# Patient Record
Sex: Female | Born: 1960 | Race: White | Hispanic: No | Smoking: Current every day smoker
Health system: Southern US, Community
[De-identification: ages and names within clinical notes are randomized; demographics above are authoritative.]

## PROBLEM LIST (undated history)

## (undated) DIAGNOSIS — E559 Vitamin D deficiency, unspecified: Secondary | ICD-10-CM

## (undated) DIAGNOSIS — E1140 Type 2 diabetes mellitus with diabetic neuropathy, unspecified: Secondary | ICD-10-CM

## (undated) DIAGNOSIS — IMO0002 Reserved for concepts with insufficient information to code with codable children: Secondary | ICD-10-CM

## (undated) DIAGNOSIS — M5432 Sciatica, left side: Secondary | ICD-10-CM

## (undated) DIAGNOSIS — F319 Bipolar disorder, unspecified: Secondary | ICD-10-CM

## (undated) DIAGNOSIS — E039 Hypothyroidism, unspecified: Secondary | ICD-10-CM

## (undated) DIAGNOSIS — M810 Age-related osteoporosis without current pathological fracture: Secondary | ICD-10-CM

## (undated) DIAGNOSIS — F410 Panic disorder [episodic paroxysmal anxiety] without agoraphobia: Secondary | ICD-10-CM

## (undated) DIAGNOSIS — M5126 Other intervertebral disc displacement, lumbar region: Secondary | ICD-10-CM

## (undated) DIAGNOSIS — S322XXA Fracture of coccyx, initial encounter for closed fracture: Secondary | ICD-10-CM

## (undated) DIAGNOSIS — M549 Dorsalgia, unspecified: Secondary | ICD-10-CM

## (undated) DIAGNOSIS — M5430 Sciatica, unspecified side: Secondary | ICD-10-CM

## (undated) DIAGNOSIS — G8929 Other chronic pain: Secondary | ICD-10-CM

## (undated) DIAGNOSIS — F32A Depression, unspecified: Secondary | ICD-10-CM

## (undated) DIAGNOSIS — F329 Major depressive disorder, single episode, unspecified: Secondary | ICD-10-CM

## (undated) HISTORY — PX: CLOSED REDUCTION PROXIMAL FIBULAR FRACTURE: SUR232

## (undated) HISTORY — DX: Sciatica, left side: M54.32

## (undated) HISTORY — PX: FOOT SURGERY: SHX648

## (undated) HISTORY — DX: Vitamin D deficiency, unspecified: E55.9

## (undated) HISTORY — PX: TONSILLECTOMY: SUR1361

## (undated) HISTORY — PX: ABDOMINAL HYSTERECTOMY: SHX81

---

## 1997-04-03 ENCOUNTER — Ambulatory Visit (HOSPITAL_COMMUNITY): Admission: RE | Admit: 1997-04-03 | Discharge: 1997-04-03 | Payer: Self-pay | Admitting: Obstetrics and Gynecology

## 1997-08-30 ENCOUNTER — Emergency Department (HOSPITAL_COMMUNITY): Admission: EM | Admit: 1997-08-30 | Discharge: 1997-08-30 | Payer: Self-pay | Admitting: Emergency Medicine

## 1997-09-09 ENCOUNTER — Encounter: Admission: RE | Admit: 1997-09-09 | Discharge: 1997-12-08 | Payer: Self-pay | Admitting: Anesthesiology

## 1998-05-13 ENCOUNTER — Ambulatory Visit (HOSPITAL_COMMUNITY): Admission: RE | Admit: 1998-05-13 | Discharge: 1998-05-13 | Payer: Self-pay | Admitting: Orthopedic Surgery

## 1998-05-13 ENCOUNTER — Encounter: Payer: Self-pay | Admitting: Orthopedic Surgery

## 1998-06-10 ENCOUNTER — Other Ambulatory Visit: Admission: RE | Admit: 1998-06-10 | Discharge: 1998-06-10 | Payer: Self-pay | Admitting: Obstetrics and Gynecology

## 1998-07-08 ENCOUNTER — Other Ambulatory Visit: Admission: RE | Admit: 1998-07-08 | Discharge: 1998-07-08 | Payer: Self-pay | Admitting: Obstetrics and Gynecology

## 1998-08-28 ENCOUNTER — Encounter: Payer: Self-pay | Admitting: Obstetrics and Gynecology

## 1998-08-28 ENCOUNTER — Ambulatory Visit (HOSPITAL_COMMUNITY): Admission: RE | Admit: 1998-08-28 | Discharge: 1998-08-28 | Payer: Self-pay | Admitting: Obstetrics and Gynecology

## 1998-09-24 ENCOUNTER — Encounter: Payer: Self-pay | Admitting: Gastroenterology

## 1998-09-24 ENCOUNTER — Ambulatory Visit (HOSPITAL_COMMUNITY): Admission: RE | Admit: 1998-09-24 | Discharge: 1998-09-24 | Payer: Self-pay | Admitting: Gastroenterology

## 1998-10-15 ENCOUNTER — Other Ambulatory Visit: Admission: RE | Admit: 1998-10-15 | Discharge: 1998-10-15 | Payer: Self-pay | Admitting: Obstetrics and Gynecology

## 1999-02-17 ENCOUNTER — Encounter: Payer: Self-pay | Admitting: Obstetrics and Gynecology

## 1999-02-17 ENCOUNTER — Ambulatory Visit (HOSPITAL_COMMUNITY): Admission: RE | Admit: 1999-02-17 | Discharge: 1999-02-17 | Payer: Self-pay | Admitting: Obstetrics and Gynecology

## 1999-02-24 ENCOUNTER — Encounter (INDEPENDENT_AMBULATORY_CARE_PROVIDER_SITE_OTHER): Payer: Self-pay

## 1999-02-24 ENCOUNTER — Encounter: Payer: Self-pay | Admitting: Obstetrics and Gynecology

## 1999-02-24 ENCOUNTER — Ambulatory Visit (HOSPITAL_COMMUNITY): Admission: RE | Admit: 1999-02-24 | Discharge: 1999-02-24 | Payer: Self-pay | Admitting: Obstetrics and Gynecology

## 2001-03-06 ENCOUNTER — Encounter: Payer: Self-pay | Admitting: Emergency Medicine

## 2001-03-06 ENCOUNTER — Emergency Department (HOSPITAL_COMMUNITY): Admission: EM | Admit: 2001-03-06 | Discharge: 2001-03-06 | Payer: Self-pay | Admitting: Emergency Medicine

## 2001-05-05 ENCOUNTER — Other Ambulatory Visit: Admission: RE | Admit: 2001-05-05 | Discharge: 2001-05-05 | Payer: Self-pay | Admitting: *Deleted

## 2001-05-10 ENCOUNTER — Encounter: Payer: Self-pay | Admitting: *Deleted

## 2001-05-10 ENCOUNTER — Encounter: Admission: RE | Admit: 2001-05-10 | Discharge: 2001-05-10 | Payer: Self-pay | Admitting: *Deleted

## 2002-03-01 ENCOUNTER — Ambulatory Visit (HOSPITAL_COMMUNITY): Admission: RE | Admit: 2002-03-01 | Discharge: 2002-03-01 | Payer: Self-pay | Admitting: Family Medicine

## 2002-03-02 ENCOUNTER — Ambulatory Visit (HOSPITAL_COMMUNITY): Admission: RE | Admit: 2002-03-02 | Discharge: 2002-03-02 | Payer: Self-pay | Admitting: Family Medicine

## 2002-03-02 ENCOUNTER — Encounter: Payer: Self-pay | Admitting: Family Medicine

## 2002-03-16 ENCOUNTER — Ambulatory Visit (HOSPITAL_COMMUNITY): Admission: RE | Admit: 2002-03-16 | Discharge: 2002-03-16 | Payer: Self-pay | Admitting: Obstetrics and Gynecology

## 2002-03-16 ENCOUNTER — Encounter (INDEPENDENT_AMBULATORY_CARE_PROVIDER_SITE_OTHER): Payer: Self-pay

## 2002-06-25 ENCOUNTER — Encounter (INDEPENDENT_AMBULATORY_CARE_PROVIDER_SITE_OTHER): Payer: Self-pay

## 2002-06-25 ENCOUNTER — Observation Stay (HOSPITAL_COMMUNITY): Admission: RE | Admit: 2002-06-25 | Discharge: 2002-06-26 | Payer: Self-pay

## 2002-08-30 ENCOUNTER — Ambulatory Visit (HOSPITAL_BASED_OUTPATIENT_CLINIC_OR_DEPARTMENT_OTHER): Admission: RE | Admit: 2002-08-30 | Discharge: 2002-08-30 | Payer: Self-pay | Admitting: Orthopedic Surgery

## 2002-12-25 ENCOUNTER — Encounter: Admission: RE | Admit: 2002-12-25 | Discharge: 2002-12-25 | Payer: Self-pay | Admitting: Gastroenterology

## 2003-05-07 ENCOUNTER — Encounter: Admission: RE | Admit: 2003-05-07 | Discharge: 2003-05-07 | Payer: Self-pay | Admitting: Family Medicine

## 2003-05-16 ENCOUNTER — Encounter: Admission: RE | Admit: 2003-05-16 | Discharge: 2003-05-16 | Payer: Self-pay | Admitting: Family Medicine

## 2003-06-04 ENCOUNTER — Encounter: Admission: RE | Admit: 2003-06-04 | Discharge: 2003-06-04 | Payer: Self-pay | Admitting: Family Medicine

## 2003-07-26 ENCOUNTER — Inpatient Hospital Stay (HOSPITAL_COMMUNITY): Admission: EM | Admit: 2003-07-26 | Discharge: 2003-07-28 | Payer: Self-pay | Admitting: Emergency Medicine

## 2003-08-27 ENCOUNTER — Encounter: Admission: RE | Admit: 2003-08-27 | Discharge: 2003-08-27 | Payer: Self-pay | Admitting: Family Medicine

## 2003-09-10 ENCOUNTER — Encounter: Admission: RE | Admit: 2003-09-10 | Discharge: 2003-09-10 | Payer: Self-pay | Admitting: Family Medicine

## 2004-01-09 ENCOUNTER — Ambulatory Visit (HOSPITAL_COMMUNITY): Admission: RE | Admit: 2004-01-09 | Discharge: 2004-01-09 | Payer: Self-pay | Admitting: Family Medicine

## 2004-03-09 ENCOUNTER — Encounter: Admission: RE | Admit: 2004-03-09 | Discharge: 2004-03-09 | Payer: Self-pay | Admitting: Family Medicine

## 2004-03-12 ENCOUNTER — Emergency Department (HOSPITAL_COMMUNITY): Admission: EM | Admit: 2004-03-12 | Discharge: 2004-03-13 | Payer: Self-pay | Admitting: Emergency Medicine

## 2004-03-20 ENCOUNTER — Encounter: Admission: RE | Admit: 2004-03-20 | Discharge: 2004-03-20 | Payer: Self-pay | Admitting: Family Medicine

## 2004-07-28 ENCOUNTER — Inpatient Hospital Stay (HOSPITAL_COMMUNITY): Admission: EM | Admit: 2004-07-28 | Discharge: 2004-08-01 | Payer: Self-pay | Admitting: Emergency Medicine

## 2004-08-01 ENCOUNTER — Inpatient Hospital Stay (HOSPITAL_COMMUNITY): Admission: AD | Admit: 2004-08-01 | Discharge: 2004-08-05 | Payer: Self-pay | Admitting: Psychiatry

## 2004-08-01 ENCOUNTER — Ambulatory Visit: Payer: Self-pay | Admitting: Psychiatry

## 2004-11-09 ENCOUNTER — Ambulatory Visit (HOSPITAL_COMMUNITY): Admission: RE | Admit: 2004-11-09 | Discharge: 2004-11-09 | Payer: Self-pay | Admitting: Family Medicine

## 2005-07-09 ENCOUNTER — Ambulatory Visit: Payer: Self-pay | Admitting: Internal Medicine

## 2005-08-24 ENCOUNTER — Ambulatory Visit: Payer: Self-pay | Admitting: Family Medicine

## 2005-08-25 ENCOUNTER — Ambulatory Visit: Payer: Self-pay | Admitting: *Deleted

## 2005-09-10 ENCOUNTER — Ambulatory Visit: Payer: Self-pay | Admitting: Internal Medicine

## 2005-09-17 ENCOUNTER — Ambulatory Visit (HOSPITAL_COMMUNITY): Admission: RE | Admit: 2005-09-17 | Discharge: 2005-09-17 | Payer: Self-pay | Admitting: Internal Medicine

## 2005-09-24 ENCOUNTER — Ambulatory Visit: Payer: Self-pay | Admitting: Internal Medicine

## 2005-10-01 ENCOUNTER — Ambulatory Visit (HOSPITAL_COMMUNITY): Admission: RE | Admit: 2005-10-01 | Discharge: 2005-10-01 | Payer: Self-pay | Admitting: Internal Medicine

## 2005-10-06 ENCOUNTER — Ambulatory Visit: Payer: Self-pay | Admitting: Family Medicine

## 2005-10-09 ENCOUNTER — Emergency Department (HOSPITAL_COMMUNITY): Admission: EM | Admit: 2005-10-09 | Discharge: 2005-10-09 | Payer: Self-pay | Admitting: Family Medicine

## 2005-11-16 ENCOUNTER — Emergency Department (HOSPITAL_COMMUNITY): Admission: EM | Admit: 2005-11-16 | Discharge: 2005-11-16 | Payer: Self-pay | Admitting: Family Medicine

## 2006-06-23 IMAGING — NM NM BONE MULTIPLE AREAS
8 series · 8 of 8 positions shown · non-contrast
Comparison: none

CLINICAL DATA: SI joint and coccygeal pain. 
 NUCLEAR MEDICINE BONE SCAN ? MULTIPLE:
TECHNIQUE: Early and delayed static images were obtained in the pelvis in multiple planes and projections.  
 25.3 mCi VcDDm MDP utilized. 
 On both early and delayed static images, no abnormal uptake is noted in the SI joints, sacrococcygeal region, or elsewhere in the pelvis.  The kidneys function without obstruction.

[st statics, dual detec · 2.36mm/px · 1 of 1 slices shown (1 of 8)]
[im 1/1]
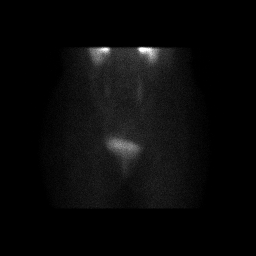

[st statics, dual detec · 2.36mm/px · 1 of 1 slices shown (2 of 8)]
[im 1/1]
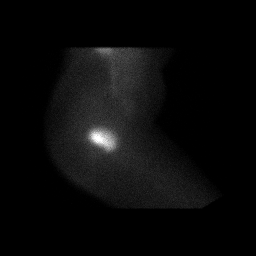

[st statics, dual detec · 2.33mm/px · 1 of 1 slices shown (3 of 8)]
[im 1/1]
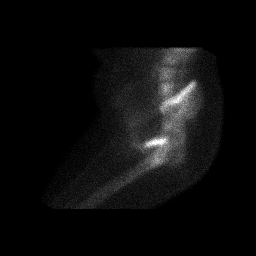

[st statics, dual detec · 2.33mm/px · 1 of 1 slices shown (4 of 8)]
[im 1/1]
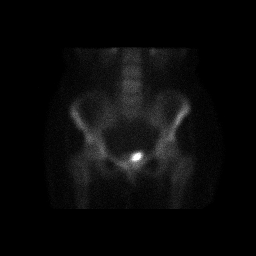

[st statics, dual detec · 2.36mm/px · 1 of 1 slices shown (5 of 8)]
[im 1/1]
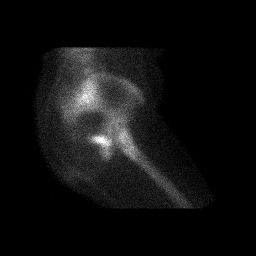

[st statics, dual detec · 2.33mm/px · 1 of 1 slices shown (6 of 8)]
[im 1/1]
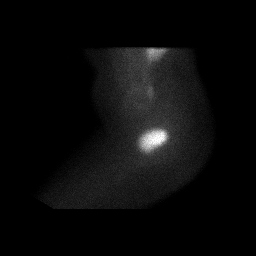

[st statics, dual detec · 2.36mm/px · 1 of 1 slices shown (7 of 8)]
[im 1/1]
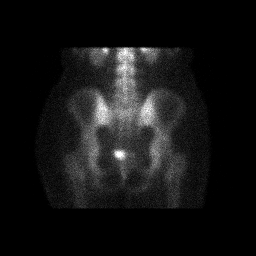

[st statics, dual detec · 2.33mm/px · 1 of 1 slices shown (8 of 8)]
[im 1/1]
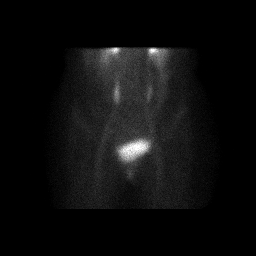

[8 of 8 positions shown; findings below may reference images not displayed]

IMPRESSION: Normal exam.

## 2007-03-28 ENCOUNTER — Ambulatory Visit (HOSPITAL_COMMUNITY): Admission: RE | Admit: 2007-03-28 | Discharge: 2007-03-28 | Payer: Self-pay | Admitting: Internal Medicine

## 2007-08-12 ENCOUNTER — Emergency Department (HOSPITAL_COMMUNITY): Admission: EM | Admit: 2007-08-12 | Discharge: 2007-08-13 | Payer: Self-pay | Admitting: Family Medicine

## 2008-03-20 ENCOUNTER — Ambulatory Visit: Admission: RE | Admit: 2008-03-20 | Discharge: 2008-03-20 | Payer: Self-pay | Admitting: Anesthesiology

## 2008-05-10 ENCOUNTER — Ambulatory Visit (HOSPITAL_COMMUNITY): Admission: RE | Admit: 2008-05-10 | Discharge: 2008-05-10 | Payer: Self-pay | Admitting: Internal Medicine

## 2008-06-10 ENCOUNTER — Ambulatory Visit (HOSPITAL_COMMUNITY): Admission: RE | Admit: 2008-06-10 | Discharge: 2008-06-10 | Payer: Self-pay | Admitting: Internal Medicine

## 2009-05-19 ENCOUNTER — Encounter: Payer: Self-pay | Admitting: Cardiology

## 2009-07-01 ENCOUNTER — Encounter: Payer: Self-pay | Admitting: Cardiology

## 2009-07-01 DIAGNOSIS — R5381 Other malaise: Secondary | ICD-10-CM

## 2009-07-01 DIAGNOSIS — E039 Hypothyroidism, unspecified: Secondary | ICD-10-CM | POA: Insufficient documentation

## 2009-07-01 DIAGNOSIS — R002 Palpitations: Secondary | ICD-10-CM | POA: Insufficient documentation

## 2009-07-01 DIAGNOSIS — R5383 Other fatigue: Secondary | ICD-10-CM

## 2009-12-24 ENCOUNTER — Emergency Department (HOSPITAL_COMMUNITY): Admission: EM | Admit: 2009-12-24 | Discharge: 2009-12-24 | Payer: Self-pay | Admitting: Emergency Medicine

## 2010-01-12 ENCOUNTER — Emergency Department (HOSPITAL_COMMUNITY)
Admission: EM | Admit: 2010-01-12 | Discharge: 2010-01-12 | Payer: Self-pay | Source: Home / Self Care | Admitting: Emergency Medicine

## 2010-01-27 ENCOUNTER — Encounter
Admission: RE | Admit: 2010-01-27 | Discharge: 2010-01-27 | Payer: Self-pay | Source: Home / Self Care | Attending: Neurosurgery | Admitting: Neurosurgery

## 2010-01-28 ENCOUNTER — Encounter
Admission: RE | Admit: 2010-01-28 | Discharge: 2010-01-28 | Payer: Self-pay | Source: Home / Self Care | Attending: Neurosurgery | Admitting: Neurosurgery

## 2010-02-21 ENCOUNTER — Encounter: Payer: Self-pay | Admitting: Gastroenterology

## 2010-02-21 ENCOUNTER — Encounter: Payer: Self-pay | Admitting: Family Medicine

## 2010-02-22 ENCOUNTER — Encounter: Payer: Self-pay | Admitting: Internal Medicine

## 2010-02-22 ENCOUNTER — Encounter: Payer: Self-pay | Admitting: Family Medicine

## 2010-03-03 NOTE — Letter (Signed)
Summary: Appointment -missed  [facility] at [facility] [address]   [place], [place] [address]   Phone: [phone]  Fax: [phone]     Jul 01, 2009 MRN: [id number]     Kari Gonzales [address] [place], [place]  [address]     Dear Ms. Kari Gonzales,  Our records indicate you missed your appointment on Jul 01, 2009                        with Dr.   Antoine Poche.   It is very important that we reach you to reschedule this appointment. We look forward to participating in your health care needs.   Please contact us at the number listed above at your earliest convenience to reschedule this appointment.   Sincerely,    Glass blower/designer

## 2010-03-03 NOTE — Letter (Signed)
Summary: External Correspondence/ OFFICE VISIT DR. ZACK HALL  External Correspondence/ OFFICE VISIT DR. ZACK HALL   Imported By: Dorise Hiss 07/01/2009 09:26:31  _____________________________________________________________________  External Attachment:    Type:   Image     Comment:   External Document

## 2010-03-03 NOTE — Letter (Signed)
Summary: External Correspondence/ OFFICE VISIT DR. MCINNIS  External Correspondence/ OFFICE VISIT DR. MCINNIS   Imported By: Dorise Hiss 06/05/2009 14:26:59  _____________________________________________________________________  External Attachment:    Type:   Image     Comment:   External Document

## 2010-03-12 ENCOUNTER — Emergency Department (HOSPITAL_COMMUNITY)
Admission: EM | Admit: 2010-03-12 | Discharge: 2010-03-12 | Disposition: A | Discharge status: Home / Self Care | Payer: Medicare Other | Attending: Emergency Medicine | Admitting: Emergency Medicine

## 2010-03-12 DIAGNOSIS — E119 Type 2 diabetes mellitus without complications: Secondary | ICD-10-CM | POA: Insufficient documentation

## 2010-03-12 DIAGNOSIS — IMO0002 Reserved for concepts with insufficient information to code with codable children: Secondary | ICD-10-CM | POA: Insufficient documentation

## 2010-04-08 ENCOUNTER — Encounter: Payer: Self-pay | Admitting: Family Medicine

## 2010-04-08 ENCOUNTER — Ambulatory Visit (INDEPENDENT_AMBULATORY_CARE_PROVIDER_SITE_OTHER): Payer: Medicare Other | Admitting: Family Medicine

## 2010-04-08 DIAGNOSIS — M810 Age-related osteoporosis without current pathological fracture: Secondary | ICD-10-CM | POA: Insufficient documentation

## 2010-04-08 DIAGNOSIS — M2170 Unequal limb length (acquired), unspecified site: Secondary | ICD-10-CM | POA: Insufficient documentation

## 2010-04-08 DIAGNOSIS — M76899 Other specified enthesopathies of unspecified lower limb, excluding foot: Secondary | ICD-10-CM

## 2010-04-08 DIAGNOSIS — M545 Low back pain: Secondary | ICD-10-CM

## 2010-04-08 DIAGNOSIS — M533 Sacrococcygeal disorders, not elsewhere classified: Secondary | ICD-10-CM

## 2010-04-08 DIAGNOSIS — E109 Type 1 diabetes mellitus without complications: Secondary | ICD-10-CM | POA: Insufficient documentation

## 2010-04-14 NOTE — Assessment & Plan Note (Signed)
Summary: BACK PAIN Room 5   Vital Signs:  Patient Profile:   50 Years Old Female CC:      [name] radiates into left leg, diabetic neuropathy in right calf Height:     65 inches Weight:      163 pounds O2 Sat:      98 % O2 treatment:    Room Air Temp:     98.7 degrees F oral Pulse rate:   102 / minute Pulse rhythm:   regular Resp:     18 per minute BP sitting:   126 / 86  (left arm) Cuff size:   regular  Vitals Entered By: Emilio Math (April 08, 2010 2:34 PM)                  Current Allergies: ! SULFA ! MORPHINEHistory of Present Illness Chief Complaint: [name] radiates into left leg, diabetic neuropathy in right calf History of Present Illness:  Subjective:  Patient states that she has a long history of recurring lower back pain and left sciatica.  She notes that her left leg is 3/4inch longer than right, and she has an orthotic insert for her right shoe but often does not use it because she must wear high top shoes.  She developed flareup of pain in her left hip area yesterday.  She has pain with ambulation, sitting, and climbing stairs.  No bowel or bladder dysfunction.  No saddle numbness.  No fevers, chills, and sweats.  No weight loss.  No recent trauma or change in activities. She states that she has an up-coming appt with a new PCP for management of chronic problems.  REVIEW OF SYSTEMS Constitutional Symptoms      Denies fever, chills, night sweats, weight loss, weight gain, and fatigue.  Eyes       Denies change in vision, eye pain, eye discharge, glasses, contact lenses, and eye surgery. Ear/Nose/Throat/Mouth       Denies hearing loss/aids, change in hearing, ear pain, ear discharge, dizziness, frequent runny nose, frequent nose bleeds, sinus problems, sore throat, hoarseness, and tooth pain or bleeding.  Respiratory       Denies dry cough, productive cough, wheezing, shortness of breath, asthma, bronchitis, and emphysema/COPD.  Cardiovascular       Denies  murmurs, chest pain, and tires easily with exhertion.    Gastrointestinal       Denies stomach pain, nausea/vomiting, diarrhea, constipation, blood in bowel movements, and indigestion. Genitourniary       Denies painful urination, kidney stones, and loss of urinary control. Neurological       Denies paralysis, seizures, and fainting/blackouts. Musculoskeletal       Complains of muscle pain, joint pain, joint stiffness, and decreased range of motion.      Denies redness, swelling, muscle weakness, and gout.  Skin       Denies bruising, unusual mles/lumps or sores, and hair/skin or nail changes.  Psych       Denies mood changes, temper/anger issues, anxiety/stress, speech problems, depression, and sleep problems.  Past History:  Past Medical History: Weakness Paliptations Hypothyroidism Bulging discs Diabetes mellitus, type I Osteoporosis  Past Surgical History: Hysterectomy Tonsillectomy  Family History: Mother, Raynaud Disease, RA Father, Cardiomegly, HTN  Social History: 1/2 ppd, 30 yrs ETOH-yes No Drugs Disability   Objective:  Patient in no acute distress, but appears uncomfortable when standing/walking Eyes:  Pupils are equal, round, and reactive to light and accomdation.  Extraocular movement is intact.  Conjunctivae  are not inflamed.  Pharynx:  Normal  Neck:  Supple.  No adenopathy is present.  No thyromegaly is present  Lungs:  Clear to auscultation.  Breath sounds are equal.  Heart:  Regular rate and rhythm without murmurs, rubs, or gallops.  Abdomen:  Nontender without masses or hepatosplenomegaly.  Bowel sounds are present.  No CVA or flank tenderness.   Back:   Decreased range of motion.   Tenderness in the midline and left  paraspinous muscles from L3 to Sacral area.  Distinct tenderness over the left SI joint.  Straight leg raising test is negative.  Sitting knee extension test is negative.  Strength and sensation in the lower extremities is normal.   Patellar and achilles reflexes are normal.  Extremities:  No edema.  There is distinct tenderness over the left greater trochanter.  Palpation there recreates her pain, especially with resisted lateral abduction.  Left leg is approximately 2cm longer than the right by inspection. 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) TROCHANTERIC BURSITIS, LEFT (ICD-726.5) BACK PAIN, LUMBAR, CHRONIC (ICD-724.2) OSTEOPOROSIS (ICD-733.00) DIABETES MELLITUS, TYPE I (ICD-250.01)  SUSPECT LEFT SACROILIAC JOINT INFLAMMATION  Plan New Medications/Changes: LORTAB 5 5-500 MG TABS (HYDROCODONE-ACETAMINOPHEN) One or two tabs by mouth hs as needed pain  #12 (twelve) x 0, 04/08/2010, Donna Christen MD VOLTAREN 1 % GEL (DICLOFENAC SODIUM) Apply 3gms  to affected area two times a day to three times a day  #100gm x 2, 04/08/2010, Donna Christen MD  New Orders: Admin of Therapeutic Inj  intramuscular or subcutaneous [40981] New Patient Level IV [99204] Injection, Tendon / Ligament [20550] Ketorolac-Toradol 15mg  [J1885] Planning Comments:   Toradol 60mg  IM Trigger point injection left trochanteric bursa. Begin ice packs several times daily.  Analgesic for bedtime.  Rx for Voltaren gel to apply to lower back.  Begin back exercises ([facility] information and instruction patient handout given).  Recommend that she resume her orthotic device in right shoe.  Recommend follow-up with PCP for management of chronic problems.   The patient and/or caregiver has been counseled thoroughly with regard to medications prescribed including dosage, schedule, interactions, rationale for use, and possible side effects and they verbalize understanding.  Diagnoses and expected course of recovery discussed and will return if not improved as expected or if the condition worsens. Patient and/or caregiver verbalized understanding.   PROCEDURE:   Joint Injection Site:  Left hip greater trochanter Anesthesia: Local 1% plain Lidocaine Procedure:  With sterile technique and local anesthesia with 1% plain lidocaine, injected 20mg  Aristocort with 1.5cc of 1% plain lidocaine over the greater trochanter in a fan pattern.  The risks and benefits were explained to the patient prior to performing the procedure and consent was obtained.  Disposition: Home Prescriptions: LORTAB 5 5-500 MG TABS (HYDROCODONE-ACETAMINOPHEN) One or two tabs by mouth hs as needed pain  #12 (twelve) x 0   Entered and Authorized by:   Donna Christen MD   Signed by:   Donna Christen MD on 04/08/2010   Method used:   Print then Give to Patient   RxID:   [id number] VOLTAREN 1 % GEL (DICLOFENAC SODIUM) Apply 3gms  to affected area two times a day to three times a day  #100gm x 2   Entered and Authorized by:   Donna Christen MD   Signed by:   Donna Christen MD on 04/08/2010   Method used:   Print then Give to Patient   RxID:  [id number]   Medication Administration  Injection # 1:    Medication: Ketorolac-Toradol 15mg     Diagnosis: OSTEOPOROSIS (ICD-733.00)    Route: IM    Site: RUOQ gluteus    Exp Date: 12/03/2010    Lot #: [id number]    Mfr: hospira    Comments: 60mg  given    Patient tolerated injection without complications    Given by: Lajean Saver RN (April 08, 2010 3:18 PM)  Orders Added: 1)  Admin of Therapeutic Inj  intramuscular or subcutaneous [96372] 2)  New Patient Level IV [99204] 3)  Injection, Tendon / Ligament [20550] 4)  Ketorolac-Toradol 15mg  [W4132]

## 2010-04-22 ENCOUNTER — Telehealth: Payer: Self-pay | Admitting: Emergency Medicine

## 2010-04-30 NOTE — Progress Notes (Signed)
     Follow-up for Phone Call       Follow-up by: Hoyt Koch MD,  April 22, 2010 6:38 PM    Additional Follow-up for Phone Call Additional follow up Details #2::    Patient came to clinic complaining of same symptoms as last week (sciatica symptoms,  hip pain).  Looking on the [place] registry, she was just prescribed 1 week ago Soma #63 and Vicoprofen #45.  She has received 7 narcotic Rx in 2012 so far.  We referred her directly to ortho since we do not manage chronic pain.  I followed the patient outside and watched as she changed from a limping gait and holding her back to a completely normal gait in our parking lot and was able to get into her car with visibly no difficulty.  From her online records, I believe she might be a narcotic abuser and needs to be followed by her PCP and perhaps pain management for her problems (she already sees Dr. Yetta Barre for psych).  She needs to choose 1 physician and stick with them rather than going to multiple doctors for her pain symptoms.  We will not be refilling any medications here at this clinic for any pain symptoms.  If she has worsening pain, she was advised to return to the ER tonight if necessary. Follow-up by: Hoyt Koch MD,  April 22, 2010 6:43 PM

## 2010-06-16 NOTE — Procedures (Signed)
Kari Gonzales, Kari Gonzales               ACCOUNT NO.:  [id number]   MEDICAL RECORD NO.:  [id number]          PATIENT TYPE:  OUT   LOCATION:  SLEE                          FACILITY:  [facility]   PHYSICIAN:  Kofi A. Gerilyn Pilgrim, M.D. DATE OF BIRTH:  [birth year]   DATE OF PROCEDURE:  03/20/2008  DATE OF DISCHARGE:  03/20/2008                             SLEEP DISORDER REPORT   NOCTURNAL POLYSOMNOGRAPHY REPORT   REFERRING PHYSICIAN:  Catalina Pizza, MD   INDICATION:  This is a 50 year old lady who presents with snoring,  fatigue, insomnia, and is being evaluated for obstructive sleep apnea  syndrome.   MEDICATIONS:  Vicodin, levothyroxine, Benadryl, multivitamin, and Gas-X.   Epworth sleepiness scale 8.  BMI 30.   ARCHITECTURAL SUMMARY:  The total recording time is 383 minutes.  The  sleep efficiency is 77%, sleep latency is 67 minutes, and REM latency  100 minutes.  Stage N1 is 3%, N2 40%, N3 41%, and REM sleep 16%.   RESPIRATORY SUMMARY:  Oxygen saturation at baseline is 98%, the lowest  is 86%.  AHI is 7.   LIMB MOVEMENT INDEX:  PLM index is 0.   ELECTROCARDIOGRAM SUMMARY:  The average heart rate is 70 with no  significant dysrhythmias observed.   IMPRESSION:  Mild obstructive sleep apnea syndrome that does not require  positive pressure treatments.  Thanks for this referral.      Kofi A. Gerilyn Pilgrim, M.D.  Electronically Signed     KAD/MEDQ  D:  03/27/2008  T:  03/27/2008  Job:  [id number]
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
## 2010-06-19 NOTE — Op Note (Signed)
NAME:  Kari Gonzales                           ACCOUNT NO.:  [id number]   MEDICAL RECORD NO.:  [id number]                   PATIENT TYPE:  OBV   LOCATION:  9124                                 FACILITY:  [facility]   PHYSICIAN:  Lenoard Aden, M.D.             DATE OF BIRTH:  [birth year]   DATE OF PROCEDURE:  06/25/2002  DATE OF DISCHARGE:                                 OPERATIVE REPORT   PREOPERATIVE DIAGNOSES:  1. Menomenorrhagia.  2. Pelvic pain.  3. Dysmenorrhea.   POSTOPERATIVE DIAGNOSES:  1. Menomenorrhagia.  2. Pelvic pain.  3. Dysmenorrhea.  4. Enterocele.   PROCEDURE:  Laparoscopically assisted vaginal hysterectomy and McCall  culdoplasty.   SURGEON:  Lenoard Aden, M.D.   ASSISTANT:  Pershing Cox, M.D.   ANESTHESIA:  General anesthesia.   ESTIMATED BLOOD LOSS:  100 mL.   COMPLICATIONS:  None.   DRAINS:  Foley catheter and vaginal packing.   DISPOSITION:  The patient taken to recovery room in good condition.   FINDINGS:  A 110 g uterus, normal tubes and normal ovaries.   DESCRIPTION OF PROCEDURE:  After being apprised of the risks of anesthesia,  infection, bleeding, inability to cure pelvic pain, the patient was brought  to the operating room and administered general anesthetic without  complications, prepped and draped in the usual sterile fashion.  Foley  catheter placed.  After achieving adequate anesthesia, Hulka tenaculum was  placed per vagina.  Exam under anesthesia reveals a retroflexed uterus and  no adnexal masses.  Dilute Marcaine solution placed in the umbilicus.  Feet  previously placed in Allen stirrups.  At this time, infraumbilical incision  made with a scalpel.  Veress needle placed.  Admitting pressure -2 noted.  Four liters CO2 insufflated without difficulty.  The 5 mm trocar sites were  placed in the bilateral right and left lower quadrant ports after  transillumination.  Previous visualization done prior to the placement of  the 5 mm trocars reveals atraumatic trocars reveals atraumatic trocar entry.  Normal bowel and omentum.  Normal liver, gallbladder bed.  Appendix not  visualized.  Bulky retroflexed uterus.  No evidence of endometriosis.  Bilateral normal tubes and ovaries.   At this time, the left round ligament was grasped and ligated using tripolar  tubo-ovarian ligament was grasped and ligated using the tripolar  skeletonized down to the level of the uterine vessels by dividing the broad  ligament using the tripolar ureter has been identified on the left side.  This procedure was taken down to the level of the uterine vessels with  identification of the ureters during the entire process.  The same procedure  was done on the right side without complication.  Bladder flap was developed  sharply using fine scissors in atraumatic fashion of the lower uterine  segment.  Bladder flap was well developed.  Attention is then turned to the  vaginal portion of the procedure where a biweighted speculum was placed.  The cervix was grasped anteriorly and posteriorly using Jacob's tenaculum  and infiltrated using dilute Pitressin solution circumferentially at the  cervicovaginal junction.  This area was then undermined and scored using  electrocautery and undermined using Metzenbaum scissors.   Posterior cul-de-sac entry was made atraumatically.  Long weighted speculum  was placed.  Bilateral uterosacral ligaments were grasped with the Heaney  clamp, suture ligated and transfixed to the vaginal cuff.  Second bite along  the cardinal ligament was taken and transected via the suture of 0 Vicryl.  LigaSure was then used to ligate and cut progressively the cardinal and  broad ligament pedicles before which anterior cul-de-sac entry is made  atraumatically and sharply. Deaver is placed. Then these pedicles of the  uterine vesicles, broad and cardinal ligament complexes were cauterized,  desiccated and divided using the  LigaSure.  Specimen is removed.  Good  hemostasis was noted.  Enterocele was identified and closed using an  internal and external McCall culdoplasty suture of 0 Vicryl suture.  Good  support of the posterior cul-de-sac was then obtained and reduction of the  enterocele was noted.  Vagina was then closed using 0 Vicryl pop offs in a  figure-of-eight fashion, interrupted sutures.  Uterosacrals were tied in the  midline.  Vaginal packings were placed.  Urine was clear.   Attention was then turned to the abdominal portion of the procedure where  normal tubes and ovaries were noted.  Ureters identified bilaterally.  Vaginal cuff appeared hemostatic.  There was some small bleeding along the  bladder flap which was cauterized using the tripolar.  Good hemostasis  achieved.  The CO2 was released.  Revisualization revealed no evidence of  active bleeding.  All instruments were removed under direct visualization.  CO2 was released.  Incisions were closed using 0 Vicryl and Dermabond.  The  patient tolerated the procedure well and was transferred to the recovery  room in good condition.                                               Lenoard Aden, M.D.    RJT/MEDQ  D:  06/25/2002  T:  06/25/2002  Job:  [id number]

## 2010-06-19 NOTE — Op Note (Signed)
   NAME:  Kari Gonzales                           ACCOUNT NO.:  [id number]   MEDICAL RECORD NO.:  [id number]                   PATIENT TYPE:  AMB   LOCATION:  SDC                                  FACILITY:  [facility]   PHYSICIAN:  Lenoard Aden, M.D.             DATE OF BIRTH:  October 21, 1960   DATE OF PROCEDURE:  03/16/2002  DATE OF DISCHARGE:                                 OPERATIVE REPORT   PREOPERATIVE DIAGNOSES:  Premature ovarian failure with postmenopausal  bleeding.   POSTOPERATIVE DIAGNOSES:  Premature ovarian failure with postmenopausal  bleeding.   PROCEDURE:  1. Diagnostic hysteroscopy.  2. Dilatation and curettage.   SURGEON:  Lenoard Aden, M.D.   ANESTHESIA:  General.   ESTIMATED BLOOD LOSS:  Less than 50 mL.   FLUID DEFICIT:  None.   COMPLICATIONS:  None.   DISPOSITION:  The patient to recovery in good condition.   BRIEF OPERATIVE NOTE:  After being apprised of the risks of anesthesia,  infection, bleeding, inability to cure pelvic pain with possible need for  further intervention, patient is brought to the operating room where she is  administered general anesthetic without complications, prepped and draped in  usual sterile fashion, catheterized until the bladder is empty.  Examination  under anesthesia reveals a retroflexed uterus and no adnexal masses are  appreciated on examination under anesthesia.  Dilute Pitressin solution  placed at 3 and 9 o'clock 10 mL in 100, total of 16 mL.  The cervix was  easily dilated up to a number 25 Pratt dilator.  Diagnostic hysteroscope  placed.  Visualization reveals an atrophic appearing endometrium, bilateral  normal tubal ostia.  At this time D&C is performed to obtain adequate  endometrial tissue.  Good hemostasis is achieved.  Fluid deficit of 0.  Repeat visualization reveals an otherwise normal endometrium.  No evidence  of polyps or fibroids are noted.  Instruments are removed from the vagina.  Minimal  bleeding is noted.  The patient tolerates procedure well and is  transferred to recovery room in good condition.   SPECIMENS:  Endometrial curettings to pathology.                                              Lenoard Aden, M.D.   RJT/MEDQ  D:  03/16/2002  T:  03/16/2002  Job:  [id number]   cc:   [facility]
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
## 2010-06-19 NOTE — H&P (Signed)
   NAME:  Kari Gonzales                           ACCOUNT NO.:  [id number]   MEDICAL RECORD NO.:  [id number]                   PATIENT TYPE:  AMB   LOCATION:  SDC                                  [facility]ILITY[facility]   PHYSICIAN:  Lenoard Aden, M.D.             DATE OF BIRTH:  [birth year]   DATE OF ADMISSION:  03/16/2002  DATE OF DISCHARGE:                                HISTORY & PHYSICAL   CHIEF COMPLAINT:  Lower abdominal pain and irregular bleeding.   HISTORY OF PRESENT ILLNESS:  The patient is a 50 year old white female G 4,  P 1-0-3-1 with a history of premature ovarian failure now with  postmenopausal bleeding.  The patient is unable to tolerate endometrial  biopsy and saline hysterography in the office, has had some decrease in  bleeding on Aygestin but has now had three weeks of continuous spotting for  a definitive evaluation.  She has also had lower abdominal cramps, bloating,  and questionable pain on the Aygestin.   MEDICATIONS:  Include Aygestin and Vicodin as needed.   PAST MEDICAL HISTORY:  Remarkable for osteoporosis by the patient report,  tonsillectomy, exploratory abdominal surgery with questionable LOA in 2001,  history of TAB x3, and a vaginal delivery x1.   ALLERGIES:  ASPIRIN and CODEINE.   FAMILY HISTORY:  Remarkable for hypertension, cervical cancer, and breast  cancer.   PHYSICAL EXAMINATION:  GENERAL:  The patient is an uncomfortable-appearing  white female in no apparent distress.  HEENT:  Normal.  LUNGS:  Clear.  HEART:  Regular rhythm.  ABDOMEN:  Soft, nontender.  PELVIC:  Reveals a retroflexed uterus and no adnexal masses noted.  Tenderness bilaterally on palpation to examination is noted.  EXTREMITIES:  Reveal no cords.  NEUROLOGICAL:  Nonfocal.   IMPRESSION:  Premature ovarian failure with postmenopausal bleeding,  intolerant; inability to perform endometrial biopsy or endometrial sampling.  Plan is to perform diagnostic hysteroscopy,  possible resectoscopic surgery,  will proceed with surgery.  Risks of anesthesia, infection, including injury  to abdominal organs, need for repair was discussed, the late versus  immediate complications to include bowel and bladder injury are noted.  The  patient acknowledges and wishes to proceed.                                               Lenoard Aden, M.D.    RJT/MEDQ  D:  03/16/2002  T:  03/16/2002  Job:  [id number]

## 2010-06-19 NOTE — H&P (Signed)
NAME:  Kari Gonzales                           ACCOUNT NO.:  [id number]   MEDICAL RECORD NO.:  [id number]                   PATIENT TYPE:  INP   LOCATION:  3313                                 FACILITY:  [facility]   PHYSICIAN:  Hettie Holstein, D.O.                 DATE OF BIRTH:  1960-02-18   DATE OF ADMISSION:  07/26/2003  DATE OF DISCHARGE:                                HISTORY & PHYSICAL   PRIMARY CARE PHYSICIAN:  Unassigned.   ADMITTING TEAM:  [facility].   CHIEF COMPLAINT:  Presented to the emergency department via EMS after being  found unresponsive and with medication overdoses.   HISTORY OF PRESENT ILLNESS:  This is a 50 year old female who was found  unresponsive by a friend after taking 2600 mg of Seroquel and 32 x 50 mg  tablets of over-the-counter sleeping aid.  Urine drug screen positive for  benzodiazepines, cocaine, in addition to positive tricyclic antidepressants.  EMS found patient unresponsive. She was tachycardic.  She was given Narcan  in the field, was assessed, and her oxygenation was intact.  In the  emergency department, she remained obtunded. She was given charcoal via NG  tube and had nasal trumpet placed until she awakened and exhibited airway  protection.  Poison control had been notified by the emergency department,  and protocols were followed.  EKG was reviewed and was without QT or QRS  prolongation.   PAST MEDICAL HISTORY:  Not obtainable from the patient .  Chart says status  post laparoscopic hysterectomy and culdoplasty.  She is G4, P1.  No other  further history can be obtained.   MEDICATIONS:  Home medications are unknown.   ALLERGIES:  Unknown; however, chart reveals ASPIRIN and CODEINE listed as  allergies.   SOCIAL HISTORY:  Unknown.   REVIEW OF SYSTEMS:  Not obtainable.   PHYSICAL EXAMINATION:  VITAL SIGNS:  Stable, though she is tachycardic with  a heart rate of 118, blood pressure 109/70, respirations 14, O2 saturation  on room air 100%.  GENERAL: She is stuporous.  She does follow verbal commands.  She is  incoherent and tearful.  She is restrained.  CARDIOVASCULAR:  Normal S1 and S2.  She is tachycardic.  LUNGS:  Clear bilaterally.  She is guarding her airway.  ABDOMEN:  Soft and nontender.  EXTREMITIES:  No edema.  Restraints properly applied.  NEUROLOGIC:  Her pupils are 4 mm bilaterally and reactive.  She moves all  four extremities spontaneously.  She opens eyes to commands, though she does  remain incoherent.   LABORATORY AND X-RAY DATA:  EKG revealed a QRS of 83 msec with sinus  tachycardia.  No prolonged QTC.   Urine drug screen revealed positive benzodiazepines, positive cocaine.  Hemoglobin 13.  Sodium 135, potassium 3.3, BUN 11, creatinine 0.7, chloride  103, CO2 21, glucose 201, AST 17, ALT 15, albumin 3.8.  Chest x-ray negative.   Tricyclics were positive.  Alcohol was negative.  Aspirin negative.  Tylenol  negative.   IMPRESSION:  1. Drug overdose of multiple substances, known Seroquel and over-the-counter     sleeping aids.  Urine drug screen positive for benzodiazepines and     cocaine.  2. Suicide attempt with reported finding of a note; however, note was not     available in the emergency department at this time.  3. Incomplete database.  4. Hypokalemia.  5. Hyperglycemia.   PLAN:  1. Ms. Kari Gonzales will be admitted to the intensive care unit for close     observation.  2. She has already received charcoal in the emergency department, and we     will follow her supportively.  3. We will go ahead and initiate IV bicarbonate, maintenance IV fluids for 1     liter.  4. We will follow her serial EKGs for QT and QRS prolongation.  5. We will keep her n.p.o. for now and have psychiatry, Dr. Jeanie Sewer, see     her.  6. We will replete her potassium.  7. We will continue to follow her clinical course.  If she requires     intubation, she will be in the appropriate setting in the  intensive care     unit.                                                Hettie Holstein, D.O.    ESS/MEDQ  D:  07/26/2003  T:  07/27/2003  Job:  [id number]

## 2010-06-19 NOTE — H&P (Signed)
   NAME:  Kari Gonzales                           ACCOUNT NO.:  [id number]   MEDICAL RECORD NO.:  [id number]                   PATIENT TYPE:  AMB   LOCATION:  SDC                                  [facility]ILITY[facility]   PHYSICIAN:  Lenoard Aden, M.D.             DATE OF BIRTH:  1960/07/02   DATE OF ADMISSION:  06/25/2002  DATE OF DISCHARGE:                                HISTORY & PHYSICAL   CHIEF COMPLAINT:  Worsening lower abdominal pain and irregular bleeding.   HISTORY OF PRESENT ILLNESS:  The patient is a 50 year old white female, G4,  P1, with a history of premature ovarian failure with intermittent  postmenopausal bleeding, status post diagnostic hysteroscopy in February of  2004 with no improvement in her irregular bleeding, currently somewhat  controlled with Aygestin at that time.   MEDICATIONS:  1. Aygestin.  2. Vicodin as needed for pain.   PAST MEDICAL HISTORY:  Remarkable for:  1. Osteoporosis by patient report.  2. History of TAB x 3.  3. Vaginal delivery x 1.   PAST SURGICAL HISTORY:  1. Tonsillectomy.  2. Exploratory abdominal surgery with questionable LOA in 2001.   ALLERGIES:  ASPIRIN and CODEINE.   FAMILY HISTORY:  Remarkable for hypertension, cervical cancer, and breast  cancer.   PHYSICAL EXAMINATION:  GENERAL APPEARANCE:  She is an uncomfortable-  appearing white female in no acute distress.  HEENT:  Normal.  LUNGS:  Clear.  HEART:  Regular rate and rhythm.  ABDOMEN:  Soft and nontender.  PELVIC:  Retroflexed uterus.  No obvious adnexal masses.  EXTREMITIES:  No cords.  NEUROLOGIC:  Nonfocal.   IMPRESSION:  1. Known premature ovarian failure with refractory postmenopausal bleeding.  2. Persistent pelvic pain with history of symptomatic dysmenorrhea     refractory to medications.    PLAN:  Proceed with LAVH versus TAH.  The risks of anesthesia, infection,  bleeding, and injury to abdominal organs and need for repair were discussed.  Delayed  versus immediate complications to include bowel and bladder injury  noted.  The patient acknowledges and wishes to proceed.                                               Lenoard Aden, M.D.    RJT/MEDQ  D:  06/24/2002  T:  06/24/2002  Job:  [id number]
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
## 2010-06-20 ENCOUNTER — Inpatient Hospital Stay (INDEPENDENT_AMBULATORY_CARE_PROVIDER_SITE_OTHER)
Admission: RE | Admit: 2010-06-20 | Discharge: 2010-06-20 | Disposition: A | Discharge status: Home / Self Care | Payer: Medicare Other | Source: Ambulatory Visit | Attending: Family Medicine | Admitting: Family Medicine

## 2010-06-20 DIAGNOSIS — M545 Low back pain: Secondary | ICD-10-CM

## 2010-11-24 ENCOUNTER — Inpatient Hospital Stay (INDEPENDENT_AMBULATORY_CARE_PROVIDER_SITE_OTHER)
Admission: RE | Admit: 2010-11-24 | Discharge: 2010-11-24 | Disposition: A | Discharge status: Home / Self Care | Payer: Medicare Other | Source: Ambulatory Visit | Attending: Family Medicine | Admitting: Family Medicine

## 2010-11-24 DIAGNOSIS — M549 Dorsalgia, unspecified: Secondary | ICD-10-CM

## 2010-11-24 LAB — POCT I-STAT, CHEM 8
BUN: 17 mg/dL (ref 6–23)
Calcium, Ion: 1.12 mmol/L (ref 1.12–1.32)
HCT: 44 % (ref 36.0–46.0)
Sodium: 137 mEq/L (ref 135–145)

## 2011-02-15 ENCOUNTER — Emergency Department (HOSPITAL_COMMUNITY)
Admission: EM | Admit: 2011-02-15 | Discharge: 2011-02-15 | Disposition: A | Discharge status: Home / Self Care | Payer: Medicare Other | Attending: Emergency Medicine | Admitting: Emergency Medicine

## 2011-02-15 ENCOUNTER — Encounter (HOSPITAL_COMMUNITY): Payer: Self-pay | Admitting: *Deleted

## 2011-02-15 DIAGNOSIS — E039 Hypothyroidism, unspecified: Secondary | ICD-10-CM | POA: Insufficient documentation

## 2011-02-15 DIAGNOSIS — M5432 Sciatica, left side: Secondary | ICD-10-CM

## 2011-02-15 DIAGNOSIS — G8929 Other chronic pain: Secondary | ICD-10-CM | POA: Insufficient documentation

## 2011-02-15 DIAGNOSIS — M545 Low back pain: Secondary | ICD-10-CM

## 2011-02-15 DIAGNOSIS — F17200 Nicotine dependence, unspecified, uncomplicated: Secondary | ICD-10-CM | POA: Insufficient documentation

## 2011-02-15 DIAGNOSIS — F319 Bipolar disorder, unspecified: Secondary | ICD-10-CM | POA: Insufficient documentation

## 2011-02-15 DIAGNOSIS — Z79899 Other long term (current) drug therapy: Secondary | ICD-10-CM | POA: Insufficient documentation

## 2011-02-15 DIAGNOSIS — M549 Dorsalgia, unspecified: Secondary | ICD-10-CM | POA: Insufficient documentation

## 2011-02-15 HISTORY — DX: Panic disorder (episodic paroxysmal anxiety): F41.0

## 2011-02-15 HISTORY — DX: Depression, unspecified: F32.A

## 2011-02-15 HISTORY — DX: Major depressive disorder, single episode, unspecified: F32.9

## 2011-02-15 HISTORY — DX: Sciatica, unspecified side: M54.30

## 2011-02-15 HISTORY — DX: Reserved for concepts with insufficient information to code with codable children: IMO0002

## 2011-02-15 HISTORY — DX: Dorsalgia, unspecified: M54.9

## 2011-02-15 HISTORY — DX: Hypothyroidism, unspecified: E03.9

## 2011-02-15 HISTORY — DX: Other chronic pain: G89.29

## 2011-02-15 HISTORY — DX: Bipolar disorder, unspecified: F31.9

## 2011-02-15 MED ORDER — KETOROLAC TROMETHAMINE 30 MG/ML IJ SOLN
60.0000 mg | Freq: Once | INTRAMUSCULAR | Status: AC
  Administered 2011-02-15: 60 mg via INTRAMUSCULAR
  Filled 2011-02-15 (×2): qty 1

## 2011-02-15 MED ORDER — DIAZEPAM 5 MG PO TABS: 5.0000 mg | ORAL_TABLET | Freq: Two times a day (BID) | ORAL | Status: AC

## 2011-02-15 NOTE — ED Notes (Signed)
Pain limits movement

## 2011-02-15 NOTE — ED Notes (Signed)
Pt states "I go to an MD in [place] but can't see him until the 2/4, I don't use my lidoderm patches anymore, I just want a someone to inject something in my back"

## 2011-02-15 NOTE — ED Provider Notes (Signed)
History     CSN: [id number]  Arrival date & time 02/15/11  1413   First MD Initiated Contact with Patient 02/15/11 1546      Chief Complaint  Patient presents with  . Back Pain    (Consider location/radiation/quality/duration/timing/severity/associated sxs/prior treatment) Patient is a 51 y.o. female presenting with back pain. The history is provided by the patient. No language interpreter was used.  Back Pain  This is a chronic problem. The current episode started more than 2 days ago. The problem occurs constantly. The problem has been gradually worsening. The pain is associated with no known injury. The quality of the pain is described as shooting, burning and aching. The pain radiates to the left thigh. The pain is at a severity of 10/10. The pain is severe. The pain is the same all the time. Associated symptoms include leg pain. Pertinent negatives include no chest pain, no fever, no numbness, no perianal numbness, no bladder incontinence, no dysuria, no pelvic pain, no paresthesias, no paresis, no tingling and no weakness. She has tried NSAIDs, muscle relaxants and ice for the symptoms.   Reports 3 days of worsening LL back pain with radiation into her posterior L thigh.  Smells of ETOH. Past Medical History  Diagnosis Date  . Bipolar disorder   . Panic attacks   . Depression   . Hypothyroid   . Chronic back pain   . Sciatica   . Degenerative disk disease     Past Surgical History  Procedure Date  . Tonsillectomy   . Abdominal hysterectomy     partial    No family history on file.  History  Substance Use Topics  . Smoking status: Current Everyday Smoker -- 0.5 packs/day  . Smokeless tobacco: Not on file  . Alcohol Use: 4.2 oz/week    7 Cans of beer per week    OB History    Grav Para Term Preterm Abortions TAB SAB Ect Mult Living                  Review of Systems  Constitutional: Negative for fever.  Cardiovascular: Negative for chest pain.    Genitourinary: Negative for bladder incontinence, dysuria and pelvic pain.  Musculoskeletal: Positive for back pain.  Neurological: Negative for tingling, weakness, numbness and paresthesias.  All other systems reviewed and are negative.    Allergies  Sulfonamide derivatives and Morphine  Home Medications   Current Outpatient Rx  Name Route Sig Dispense Refill  . ACETAMINOPHEN 500 MG PO TABS Oral Take 500 mg by mouth every 6 (six) hours as needed. pain    . ALPRAZOLAM 1 MG PO TABS Oral Take 1 mg by mouth 3 (three) times daily as needed. anxiety    . BUPROPION HCL 100 MG PO TABS Oral Take 100 mg by mouth 2 (two) times daily.    Marland Kitchen GABAPENTIN 300 MG PO CAPS Oral Take 300 mg by mouth 3 (three) times daily.    . IBUPROFEN 800 MG PO TABS Oral Take 800 mg by mouth every 8 (eight) hours as needed. pain    . LEVOTHYROXINE SODIUM 88 MCG PO TABS Oral Take 88 mcg by mouth daily.    . QUETIAPINE FUMARATE 300 MG PO TABS Oral Take 300 mg by mouth at bedtime.    Marland Kitchen VITAMIN D (ERGOCALCIFEROL) 50000 UNITS PO CAPS Oral Take 50,000 Units by mouth every 7 (seven) days.    Marland Kitchen ZOLPIDEM TARTRATE 10 MG PO TABS Oral Take 10 mg by  mouth at bedtime as needed. Sleep      BP 139/98  Pulse 111  Temp(Src) 98.5 F (36.9 C) (Oral)  Resp 18  Wt 175 lb (79.379 kg)  SpO2 100%  Physical Exam  Nursing note and vitals reviewed. Constitutional: She is oriented to person, place, and time. She appears well-developed and well-nourished. She appears distressed.  HENT:  Head: Normocephalic.  Eyes: Pupils are equal, round, and reactive to light.  Neck: Normal range of motion.  Cardiovascular:       tachy  Pulmonary/Chest: Effort normal and breath sounds normal. No respiratory distress.  Musculoskeletal: Normal range of motion.  Neurological: She is alert and oriented to person, place, and time. She has normal reflexes. She displays normal reflexes. No cranial nerve deficit. She exhibits abnormal muscle tone.  Coordination normal.  Skin: Skin is warm and dry.  Psychiatric: She has a normal mood and affect.    ED Course  Procedures (including critical care time)  Labs Reviewed - No data to display No results found.   No diagnosis found.    MDM  LL back pain with sciatica to posterior L thigh.  Denies bowel or bladder problems, saddle paresthesia, or weakness.  No dysuria.  Drove today.  Known herniated disc. Will try to get her appointment moved up in [facility] from 2/4.  Hr on discharge was 86 from 111.  No longer crying at discharge with some relief.          Jethro Bastos, NP 02/15/11 1733

## 2011-02-17 NOTE — ED Provider Notes (Signed)
Medical screening examination/treatment/procedure(s) were performed by non-physician practitioner and as supervising physician I was immediately available for consultation/collaboration.  Niley Helbig, MD 02/17/11 1406 

## 2011-06-12 ENCOUNTER — Emergency Department (HOSPITAL_COMMUNITY)
Admission: EM | Admit: 2011-06-12 | Discharge: 2011-06-12 | Disposition: A | Discharge status: Home / Self Care | Payer: Medicare Other | Source: Home / Self Care | Attending: Family Medicine | Admitting: Family Medicine

## 2011-06-12 ENCOUNTER — Encounter (HOSPITAL_COMMUNITY): Payer: Self-pay | Admitting: *Deleted

## 2011-06-12 DIAGNOSIS — M5432 Sciatica, left side: Secondary | ICD-10-CM

## 2011-06-12 DIAGNOSIS — M5430 Sciatica, unspecified side: Secondary | ICD-10-CM

## 2011-06-12 HISTORY — DX: Type 2 diabetes mellitus with diabetic neuropathy, unspecified: E11.40

## 2011-06-12 HISTORY — DX: Other intervertebral disc displacement, lumbar region: M51.26

## 2011-06-12 MED ORDER — METHYLPREDNISOLONE 4 MG PO KIT: PACK | ORAL | Status: AC

## 2011-06-12 MED ORDER — METHYLPREDNISOLONE ACETATE 40 MG/ML IJ SUSP
80.0000 mg | Freq: Once | INTRAMUSCULAR | Status: AC
  Administered 2011-06-12: 80 mg via INTRAMUSCULAR

## 2011-06-12 MED ORDER — KETOROLAC TROMETHAMINE 30 MG/ML IJ SOLN
30.0000 mg | Freq: Once | INTRAMUSCULAR | Status: AC
  Administered 2011-06-12: 30 mg via INTRAMUSCULAR

## 2011-06-12 MED ORDER — KETOROLAC TROMETHAMINE 30 MG/ML IJ SOLN
INTRAMUSCULAR | Status: AC
  Filled 2011-06-12: qty 1

## 2011-06-12 MED ORDER — METHYLPREDNISOLONE ACETATE 80 MG/ML IJ SUSP
INTRAMUSCULAR | Status: AC
  Filled 2011-06-12: qty 1

## 2011-06-12 NOTE — ED Notes (Signed)
C/O flare-up of sciatic pain since last night.  C/O pain to left hip radiating down LLE w/ some intermittent parasthesias to left foot.  C/O intermittent cramping to left lower leg.  Has been using heating pad, Flexeril, IBU, and taken some doses of neurontin (does not take regularly).

## 2011-06-12 NOTE — ED Provider Notes (Signed)
History     CSN: [id number]  Arrival date & time 06/12/11  1216   First MD Initiated Contact with Patient 06/12/11 1225      Chief Complaint  Patient presents with  . Sciatica    (Consider location/radiation/quality/duration/timing/severity/associated sxs/prior treatment) Patient is a 51 y.o. female presenting with back pain. The history is provided by the patient.  Back Pain  This is a chronic problem. The current episode started yesterday. The problem has been gradually worsening. The pain is associated with no known injury (known disc disease, followed by Dr Prince Rome). The pain is present in the lumbar spine. The quality of the pain is described as shooting. The pain radiates to the left thigh, left knee and left foot. The pain is moderate. The symptoms are aggravated by certain positions. The pain is the same all the time. Associated symptoms include leg pain, paresthesias and tingling. Pertinent negatives include no fever, no abdominal pain, no bowel incontinence, no perianal numbness, no paresis and no weakness.    Past Medical History  Diagnosis Date  . Bipolar disorder   . Panic attacks   . Depression   . Hypothyroid   . Chronic back pain   . Sciatica   . Degenerative disk disease   . Diabetes mellitus   . Diabetic neuropathy   . Lumbar herniated disc     x2    Past Surgical History  Procedure Date  . Tonsillectomy   . Abdominal hysterectomy     partial    No family history on file.  History  Substance Use Topics  . Smoking status: Current Everyday Smoker -- 0.5 packs/day  . Smokeless tobacco: Not on file  . Alcohol Use: 4.2 oz/week    7 Cans of beer per week     occasional    OB History    Grav Para Term Preterm Abortions TAB SAB Ect Mult Living                  Review of Systems  Constitutional: Negative.  Negative for fever.  Gastrointestinal: Negative.  Negative for abdominal pain and bowel incontinence.  Genitourinary: Negative.     Musculoskeletal: Positive for back pain.  Skin: Negative.   Neurological: Positive for tingling and paresthesias. Negative for weakness.    Allergies  Sulfonamide derivatives and Morphine  Home Medications   Current Outpatient Rx  Name Route Sig Dispense Refill  . ACETAMINOPHEN 500 MG PO TABS Oral Take 500 mg by mouth every 6 (six) hours as needed. pain    . ALPRAZOLAM 1 MG PO TABS Oral Take 1 mg by mouth 3 (three) times daily as needed. anxiety    . FLEXERIL PO Oral Take by mouth.    Marland Kitchen LAMICTAL PO Oral Take by mouth.    Marland Kitchen LEVOTHYROXINE SODIUM 88 MCG PO TABS Oral Take 88 mcg by mouth daily.    . QUETIAPINE FUMARATE 300 MG PO TABS Oral Take 100 mg by mouth at bedtime.     Marland Kitchen ZOLPIDEM TARTRATE 10 MG PO TABS Oral Take 10 mg by mouth at bedtime as needed. Sleep    . BUPROPION HCL 100 MG PO TABS Oral Take 100 mg by mouth 2 (two) times daily.    Marland Kitchen GABAPENTIN 300 MG PO CAPS Oral Take 300 mg by mouth 3 (three) times daily.    . IBUPROFEN 800 MG PO TABS Oral Take 800 mg by mouth every 8 (eight) hours as needed. pain    . METHYLPREDNISOLONE 4  MG PO KIT  follow package directions, start on Monday. 21 tablet 0  . VITAMIN D (ERGOCALCIFEROL) 50000 UNITS PO CAPS Oral Take 50,000 Units by mouth every 7 (seven) days.      BP 145/101  Pulse 127  Temp(Src) 99 F (37.2 C) (Oral)  Resp 18  SpO2 99%  Physical Exam  Nursing note and vitals reviewed. Constitutional: She is oriented to person, place, and time. She appears well-developed and well-nourished. She appears distressed.  Abdominal: Soft. Bowel sounds are normal. She exhibits no mass. There is no tenderness. There is no rebound and no guarding.  Musculoskeletal: She exhibits tenderness.       Back:  Neurological: She is alert and oriented to person, place, and time.  Skin: Skin is warm and dry. No rash noted.    ED Course  Procedures (including critical care time)  Labs Reviewed - No data to display No results found.   1. Sciatica  neuralgia, left       MDM          Linna Hoff, MD 06/12/11 1407

## 2011-06-12 NOTE — Discharge Instructions (Signed)
See dr Prince Rome if continued problems.

## 2011-07-22 ENCOUNTER — Other Ambulatory Visit (HOSPITAL_COMMUNITY): Payer: Self-pay | Admitting: Internal Medicine

## 2011-07-22 DIAGNOSIS — Z1231 Encounter for screening mammogram for malignant neoplasm of breast: Secondary | ICD-10-CM

## 2011-08-16 ENCOUNTER — Ambulatory Visit (HOSPITAL_COMMUNITY): Payer: Medicare Other | Attending: Internal Medicine

## 2011-08-26 ENCOUNTER — Other Ambulatory Visit: Payer: Self-pay | Admitting: Internal Medicine

## 2011-08-26 DIAGNOSIS — Z1231 Encounter for screening mammogram for malignant neoplasm of breast: Secondary | ICD-10-CM

## 2011-09-09 ENCOUNTER — Ambulatory Visit: Payer: Medicare Other

## 2011-10-18 ENCOUNTER — Emergency Department (INDEPENDENT_AMBULATORY_CARE_PROVIDER_SITE_OTHER)
Admission: EM | Admit: 2011-10-18 | Discharge: 2011-10-18 | Disposition: A | Discharge status: Home / Self Care | Payer: Medicare Other | Source: Home / Self Care | Attending: Emergency Medicine | Admitting: Emergency Medicine

## 2011-10-18 ENCOUNTER — Encounter (HOSPITAL_COMMUNITY): Payer: Self-pay | Admitting: *Deleted

## 2011-10-18 ENCOUNTER — Emergency Department (INDEPENDENT_AMBULATORY_CARE_PROVIDER_SITE_OTHER): Payer: Medicare Other

## 2011-10-18 DIAGNOSIS — S7000XA Contusion of unspecified hip, initial encounter: Secondary | ICD-10-CM

## 2011-10-18 DIAGNOSIS — S300XXA Contusion of lower back and pelvis, initial encounter: Secondary | ICD-10-CM

## 2011-10-18 DIAGNOSIS — S7002XA Contusion of left hip, initial encounter: Secondary | ICD-10-CM

## 2011-10-18 HISTORY — DX: Fracture of coccyx, initial encounter for closed fracture: S32.2XXA

## 2011-10-18 HISTORY — DX: Age-related osteoporosis without current pathological fracture: M81.0

## 2011-10-18 MED ORDER — HYDROCODONE-ACETAMINOPHEN 5-325 MG PO TABS: 2.0000 | ORAL_TABLET | ORAL | Status: DC | PRN

## 2011-10-18 MED ORDER — IBUPROFEN 800 MG PO TABS: 800.0000 mg | ORAL_TABLET | Freq: Three times a day (TID) | ORAL | Status: DC | PRN

## 2011-10-18 MED ORDER — HYDROCODONE-ACETAMINOPHEN 5-325 MG PO TABS
ORAL_TABLET | ORAL | Status: AC
  Filled 2011-10-18: qty 2

## 2011-10-18 MED ORDER — HYDROCODONE-ACETAMINOPHEN 5-325 MG PO TABS
2.0000 | ORAL_TABLET | Freq: Once | ORAL | Status: AC
  Administered 2011-10-18: 2 via ORAL

## 2011-10-18 NOTE — ED Provider Notes (Signed)
History     CSN: [id number]  Arrival date & time 10/18/11  4098   First MD Initiated Contact with Patient 10/18/11 [phone]      Chief Complaint  Patient presents with  . Back Pain    (Consider location/radiation/quality/duration/timing/severity/associated sxs/prior treatment) HPI Comments: Patient history of DJD, on sciatica, states that her left leg "gave out" on her 2 days ago. States she fell backwards, hitting her tailbone on a wooden chest. States she heard a "crunch", reports achy coccygeal pain. She has been applying ice, heat, taking ibuprofen 800 mg, using a pillow. History of coccygeal fracture x3. Denies presyncope, syncope, chest pain, palpitations causing her fall. States  her left leg gives out on her" from "time to time" because of the DJD.  Denies any new neurologic symptoms or change in her back pain.  Patient is a 51 y.o. female presenting with back pain. The history is provided by the patient. No language interpreter was used.  Back Pain  This is a new problem. The current episode started 2 days ago. The problem occurs constantly. The problem has been gradually worsening. The pain is associated with falling. The quality of the pain is described as aching. The pain does not radiate. The symptoms are aggravated by twisting. The pain is worse during the day. Pertinent negatives include no chest pain, no fever, no numbness, no weight loss, no abdominal pain, no bowel incontinence, no perianal numbness, no bladder incontinence, no leg pain, no paresthesias, no paresis, no tingling and no weakness. She has tried NSAIDs, heat and ice for the symptoms. The treatment provided mild relief. Risk factors include a history of osteoporosis.    Past Medical History  Diagnosis Date  . Bipolar disorder   . Panic attacks   . Depression   . Hypothyroid   . Chronic back pain   . Sciatica   . Degenerative disk disease   . Diabetes mellitus   . Diabetic neuropathy   . Lumbar herniated disc      x2  . Osteoporosis   . Coccygeal fracture     Past Surgical History  Procedure Date  . Tonsillectomy   . Abdominal hysterectomy     partial    Family History  Problem Relation Age of Onset  . Diabetes Mother     History  Substance Use Topics  . Smoking status: Current Every Day Smoker -- 0.5 packs/day  . Smokeless tobacco: Not on file  . Alcohol Use: 4.2 oz/week    7 Cans of beer per week     occasional    OB History    Grav Para Term Preterm Abortions TAB SAB Ect Mult Living                  Review of Systems  Constitutional: Negative for fever and weight loss.  Cardiovascular: Negative for chest pain.  Gastrointestinal: Negative for abdominal pain and bowel incontinence.  Genitourinary: Negative for bladder incontinence.  Musculoskeletal: Positive for back pain.  Neurological: Negative for tingling, weakness, numbness and paresthesias.    Allergies  Sulfonamide derivatives and Morphine  Home Medications   Current Outpatient Rx  Name Route Sig Dispense Refill  . ACETAMINOPHEN 500 MG PO TABS Oral Take 500 mg by mouth every 6 (six) hours as needed. pain    . ALPRAZOLAM 1 MG PO TABS Oral Take 1 mg by mouth 3 (three) times daily as needed. anxiety    . BUPROPION HCL 100 MG PO TABS Oral Take  100 mg by mouth 2 (two) times daily.    Marland Kitchen FLEXERIL PO Oral Take by mouth.    Marland Kitchen GABAPENTIN 300 MG PO CAPS Oral Take 300 mg by mouth 3 (three) times daily.    Marland Kitchen LAMICTAL PO Oral Take by mouth.    Marland Kitchen LEVOTHYROXINE SODIUM 88 MCG PO TABS Oral Take 88 mcg by mouth daily.    . QUETIAPINE FUMARATE 300 MG PO TABS Oral Take 100 mg by mouth at bedtime.     Marland Kitchen VITAMIN D (ERGOCALCIFEROL) 50000 UNITS PO CAPS Oral Take 50,000 Units by mouth every 7 (seven) days.    Marland Kitchen HYDROCODONE-ACETAMINOPHEN 5-325 MG PO TABS Oral Take 2 tablets by mouth every 4 (four) hours as needed for pain. 20 tablet 0  . IBUPROFEN 800 MG PO TABS Oral Take 1 tablet (800 mg total) by mouth every 8 (eight) hours as  needed for pain. pain 30 tablet 0    BP 142/90  Pulse 87  Temp 98.2 F (36.8 C) (Oral)  Resp 16  SpO2 100%  Physical Exam  Nursing note and vitals reviewed. Constitutional: She is oriented to person, place, and time. She appears well-developed and well-nourished. No distress.  HENT:  Head: Normocephalic and atraumatic.  Eyes: Conjunctivae normal and EOM are normal.  Neck: Normal range of motion.  Cardiovascular: Normal rate.   Pulmonary/Chest: Effort normal.  Abdominal: She exhibits no distension.  Musculoskeletal: Normal range of motion.       Left hip: She exhibits normal range of motion, normal strength, no bony tenderness and no crepitus.       Back:       Coccygeal tenderness. Ecchymosis L hip. No pain with PROM L hip. Bilateral lower extremities nontender without new rashes or color change, baseline ROM with intact  PT pulses. Sensation baseline light touch bilaterally for Pt, DTR's symmetric and intact bilaterally KJ, Motor symmetric bilateral 5/5 hip flexion, quadriceps, hamstrings, EHL, foot dorsiflexion, foot plantarflexion, gait somewhat antalgic but without apparent new ataxia.  Neurological: She is alert and oriented to person, place, and time. Coordination normal.  Skin: Skin is warm and dry.  Psychiatric: She has a normal mood and affect. Her behavior is normal. Judgment and thought content normal.    ED Course  Procedures (including critical care time)  Labs Reviewed - No data to display Dg Sacrum/coccyx  10/18/2011  *RADIOLOGY REPORT*  Clinical Data: Larey Seat Saturday.  Hit tail bone.  Heard a crack. History of osteoporosis, sciatic nerve damage.  SACRUM AND COCCYX - 2+ VIEW  Comparison: Lumbar spine series 10/01/2005, pelvis 10/01/2005  Findings: There is no evidence for acute fracture or dislocation. No soft tissue foreign body or gas identified.  IMPRESSION: Negative exam.   Original Report Authenticated By: Patterson Hammersmith, M.D.      1. Coccygeal contusion     2. Contusion of left hip     Dg Sacrum/coccyx  10/18/2011  *RADIOLOGY REPORT*  Clinical Data: Larey Seat Saturday.  Hit tail bone.  Heard a crack. History of osteoporosis, sciatic nerve damage.  SACRUM AND COCCYX - 2+ VIEW  Comparison: Lumbar spine series 10/01/2005, pelvis 10/01/2005  Findings: There is no evidence for acute fracture or dislocation. No soft tissue foreign body or gas identified.  IMPRESSION: Negative exam.   Original Report Authenticated By: Patterson Hammersmith, M.D.     MDM  Previous records reviewed.  [place] narcotic database reviewed. Pt with no opiate rx in past 6 months.   No evidence of spinal  cord involvement based on H&P. Checking x-ray as patient has a history of osteoporosis, has bony tenderness, and has fractured this 3 times previously.   Imaging reviewed by myself. No fracture. Full report per radiology. No other red flags such as fevers/night sweats,  neurological deficits, bladder/ bowel incontinence, urinary retention, h/o CA, unexplained weight loss, pain worse at night,  h/o prolonged steroid use, h/o IVDU, h/o HIV.   Discussed imaging with patient. Will send her home with Norco, ibuprofen. She will followup with Dr. Jerl Santos, orthopedic surgeon on call in 10 days. Discussed signs and symptoms that should prompt return to the department. Patient agrees with plan.   Luiz Blare, MD 10/18/11 1130

## 2011-10-18 NOTE — ED Notes (Signed)
Pt has a h/o LBP with sciaticia.   She fell 2 days ago against a wooden chest and hit her coccyx.  The pain is worse today

## 2011-11-28 ENCOUNTER — Emergency Department (HOSPITAL_COMMUNITY)
Admission: EM | Admit: 2011-11-28 | Discharge: 2011-11-28 | Disposition: A | Discharge status: Home / Self Care | Payer: Medicare Other | Attending: Emergency Medicine | Admitting: Emergency Medicine

## 2011-11-28 ENCOUNTER — Encounter (HOSPITAL_COMMUNITY): Payer: Self-pay | Admitting: *Deleted

## 2011-11-28 DIAGNOSIS — Z8739 Personal history of other diseases of the musculoskeletal system and connective tissue: Secondary | ICD-10-CM | POA: Insufficient documentation

## 2011-11-28 DIAGNOSIS — G8929 Other chronic pain: Secondary | ICD-10-CM | POA: Insufficient documentation

## 2011-11-28 DIAGNOSIS — Z79899 Other long term (current) drug therapy: Secondary | ICD-10-CM | POA: Insufficient documentation

## 2011-11-28 DIAGNOSIS — Z8659 Personal history of other mental and behavioral disorders: Secondary | ICD-10-CM | POA: Insufficient documentation

## 2011-11-28 DIAGNOSIS — M549 Dorsalgia, unspecified: Secondary | ICD-10-CM | POA: Insufficient documentation

## 2011-11-28 DIAGNOSIS — IMO0002 Reserved for concepts with insufficient information to code with codable children: Secondary | ICD-10-CM | POA: Insufficient documentation

## 2011-11-28 DIAGNOSIS — F329 Major depressive disorder, single episode, unspecified: Secondary | ICD-10-CM | POA: Insufficient documentation

## 2011-11-28 DIAGNOSIS — F17200 Nicotine dependence, unspecified, uncomplicated: Secondary | ICD-10-CM | POA: Insufficient documentation

## 2011-11-28 DIAGNOSIS — F3289 Other specified depressive episodes: Secondary | ICD-10-CM | POA: Insufficient documentation

## 2011-11-28 DIAGNOSIS — G589 Mononeuropathy, unspecified: Secondary | ICD-10-CM | POA: Insufficient documentation

## 2011-11-28 DIAGNOSIS — E1149 Type 2 diabetes mellitus with other diabetic neurological complication: Secondary | ICD-10-CM | POA: Insufficient documentation

## 2011-11-28 DIAGNOSIS — F319 Bipolar disorder, unspecified: Secondary | ICD-10-CM | POA: Insufficient documentation

## 2011-11-28 DIAGNOSIS — M810 Age-related osteoporosis without current pathological fracture: Secondary | ICD-10-CM | POA: Insufficient documentation

## 2011-11-28 DIAGNOSIS — E039 Hypothyroidism, unspecified: Secondary | ICD-10-CM | POA: Insufficient documentation

## 2011-11-28 MED ORDER — TRAMADOL HCL 50 MG PO TABS: 50.0000 mg | ORAL_TABLET | Freq: Four times a day (QID) | ORAL | Status: DC | PRN

## 2011-11-28 MED ORDER — KETOROLAC TROMETHAMINE 60 MG/2ML IM SOLN
60.0000 mg | Freq: Once | INTRAMUSCULAR | Status: AC
  Administered 2011-11-28: 60 mg via INTRAMUSCULAR
  Filled 2011-11-28: qty 2

## 2011-11-28 MED ORDER — CYCLOBENZAPRINE HCL 10 MG PO TABS
10.0000 mg | ORAL_TABLET | Freq: Once | ORAL | Status: AC
  Administered 2011-11-28: 10 mg via ORAL
  Filled 2011-11-28: qty 1

## 2011-11-28 NOTE — ED Notes (Signed)
Pt states she has "two bulging discs," with which she has chronic pain and follows up with Dr. Mikeal Hawthorne. Pt states that due to increase in activity over past week (pt is walking more due to brother in hospital) she has exacerbated her sxs. Pt denies any recent injury.

## 2011-11-28 NOTE — ED Notes (Signed)
Pt now states that she did fall yesterday due to the "pain catching."

## 2011-11-28 NOTE — ED Notes (Signed)
PA at bedside.

## 2011-11-28 NOTE — ED Provider Notes (Signed)
Medical screening examination/treatment/procedure(s) were performed by non-physician practitioner and as supervising physician I was immediately available for consultation/collaboration.   Rolan Bucco, MD 11/28/11 [phone]

## 2011-11-28 NOTE — ED Provider Notes (Signed)
History     CSN: [id number]  Arrival date & time 11/28/11  1047   First MD Initiated Contact with Patient 11/28/11 1056      Chief Complaint  Patient presents with  . Back Pain    (Consider location/radiation/quality/duration/timing/severity/associated sxs/prior treatment) HPI Comments: Patient presents for acute management of her chronic back pain. Patient states that she has a history of sciatica and see Dr. Mikeal Hawthorne for this. Patient describes the pain as primarily in her left buttock, 10/10, with radiation down the left leg. She states that she has been walking more to visit her brother who in hospice and taking care of her father. Denies fever or chills. Denies dysuria, urgency, frequency. Denies loss of bowel or bladder control.  The history is provided by the patient. No language interpreter was used.    Past Medical History  Diagnosis Date  . Bipolar disorder   . Panic attacks   . Depression   . Hypothyroid   . Chronic back pain   . Sciatica   . Degenerative disk disease   . Diabetes mellitus   . Diabetic neuropathy   . Lumbar herniated disc     x2  . Osteoporosis   . Coccygeal fracture     Past Surgical History  Procedure Date  . Tonsillectomy   . Abdominal hysterectomy     partial    Family History  Problem Relation Age of Onset  . Diabetes Mother   . Osteoarthritis Mother   . Depression Mother   . Depression Father   . Hypertension Father     History  Substance Use Topics  . Smoking status: Current Every Day Smoker -- 0.5 packs/day  . Smokeless tobacco: Not on file  . Alcohol Use: 4.2 oz/week    7 Cans of beer per week     occasional    OB History    Grav Para Term Preterm Abortions TAB SAB Ect Mult Living   4 1   3            Review of Systems  Allergies  Sulfonamide derivatives and Morphine  Home Medications   Current Outpatient Rx  Name Route Sig Dispense Refill  . ACETAMINOPHEN 500 MG PO TABS Oral Take 500 mg by mouth every 6  (six) hours as needed. pain    . ALPRAZOLAM 1 MG PO TABS Oral Take 1 mg by mouth 3 (three) times daily as needed. anxiety    . CYCLOBENZAPRINE HCL 10 MG PO TABS Oral Take 10 mg by mouth 3 (three) times daily as needed.    Marland Kitchen FLUTICASONE PROPIONATE 50 MCG/ACT NA SUSP Nasal Place 2 sprays into the nose daily.    Marland Kitchen GABAPENTIN 300 MG PO CAPS Oral Take 600 mg by mouth 3 (three) times daily.     . IBUPROFEN 800 MG PO TABS Oral Take 1 tablet (800 mg total) by mouth every 8 (eight) hours as needed for pain. pain 30 tablet 0  . LAMOTRIGINE 200 MG PO TABS Oral Take 200 mg by mouth daily.    Marland Kitchen LEVOTHYROXINE SODIUM 88 MCG PO TABS Oral Take 88 mcg by mouth daily.    . QUETIAPINE FUMARATE 300 MG PO TABS Oral Take 100 mg by mouth at bedtime.     . TRAMADOL HCL 50 MG PO TABS Oral Take 1 tablet (50 mg total) by mouth every 6 (six) hours as needed for pain. 15 tablet 0    BP 138/103  Pulse 104  Temp 98.3 F (  36.8 C) (Oral)  Resp 18  SpO2 100%  Physical Exam  Constitutional: She appears well-developed and well-nourished.  HENT:  Head: Normocephalic and atraumatic.  Mouth/Throat: Oropharynx is clear and moist.  Eyes: Conjunctivae normal and EOM are normal. Pupils are equal, round, and reactive to light. No scleral icterus.  Cardiovascular: Normal rate, regular rhythm and normal heart sounds.   Pulmonary/Chest: Effort normal and breath sounds normal.  Abdominal: Soft. There is no tenderness.  Musculoskeletal: She exhibits tenderness. She exhibits no edema.       Arms: Neurological: She is alert. She displays normal reflexes. She exhibits normal muscle tone. Coordination normal.  Skin: Skin is warm.    ED Course  Procedures (including critical care time)  Labs Reviewed - No data to display No results found.   1. Chronic back pain       MDM  Patient presented with chronic back pain. Patient given Toradol and flexeril with some improvement. Patient discharged with Rx for tramadol and return  precautions. No red flags for cauda equina as the patient has good strength, normal reflexes, and is not incontinent.         Pixie Casino, PA-C 11/28/11 1309

## 2011-11-28 NOTE — ED Notes (Signed)
Pt is under a lot of stress right now. Her brother is on palliative care unit and is also taking care of her unwell father.

## 2012-01-03 ENCOUNTER — Other Ambulatory Visit: Payer: Self-pay | Admitting: Internal Medicine

## 2012-01-03 DIAGNOSIS — Z1231 Encounter for screening mammogram for malignant neoplasm of breast: Secondary | ICD-10-CM

## 2012-02-08 ENCOUNTER — Ambulatory Visit: Payer: Medicare Other

## 2012-06-02 ENCOUNTER — Ambulatory Visit
Admission: RE | Admit: 2012-06-02 | Discharge: 2012-06-02 | Disposition: A | Discharge status: Home / Self Care | Payer: Medicare Other | Source: Ambulatory Visit | Attending: Internal Medicine | Admitting: Internal Medicine

## 2012-06-02 DIAGNOSIS — Z1231 Encounter for screening mammogram for malignant neoplasm of breast: Secondary | ICD-10-CM

## 2012-06-05 ENCOUNTER — Other Ambulatory Visit: Payer: Self-pay | Admitting: Internal Medicine

## 2012-06-05 DIAGNOSIS — N63 Unspecified lump in unspecified breast: Secondary | ICD-10-CM

## 2012-06-28 ENCOUNTER — Ambulatory Visit
Admission: RE | Admit: 2012-06-28 | Discharge: 2012-06-28 | Disposition: A | Discharge status: Home / Self Care | Payer: Medicare Other | Source: Ambulatory Visit | Attending: Internal Medicine | Admitting: Internal Medicine

## 2012-06-28 DIAGNOSIS — N63 Unspecified lump in unspecified breast: Secondary | ICD-10-CM

## 2012-08-17 ENCOUNTER — Other Ambulatory Visit: Payer: Self-pay | Admitting: Orthopaedic Surgery

## 2012-08-17 DIAGNOSIS — M545 Low back pain: Secondary | ICD-10-CM

## 2012-08-25 ENCOUNTER — Ambulatory Visit
Admission: RE | Admit: 2012-08-25 | Discharge: 2012-08-25 | Disposition: A | Discharge status: Home / Self Care | Payer: Medicare Other | Source: Ambulatory Visit | Attending: Orthopaedic Surgery | Admitting: Orthopaedic Surgery

## 2012-08-25 DIAGNOSIS — M545 Low back pain: Secondary | ICD-10-CM

## 2013-03-07 ENCOUNTER — Other Ambulatory Visit: Payer: Self-pay | Admitting: Internal Medicine

## 2013-03-07 DIAGNOSIS — M810 Age-related osteoporosis without current pathological fracture: Secondary | ICD-10-CM

## 2013-03-29 ENCOUNTER — Other Ambulatory Visit: Payer: Medicare Other

## 2013-04-13 ENCOUNTER — Other Ambulatory Visit: Payer: Medicare Other

## 2013-05-04 ENCOUNTER — Encounter: Payer: Self-pay | Admitting: *Deleted

## 2013-05-07 ENCOUNTER — Ambulatory Visit (INDEPENDENT_AMBULATORY_CARE_PROVIDER_SITE_OTHER): Payer: Medicare Other | Admitting: Neurology

## 2013-05-07 ENCOUNTER — Encounter: Payer: Self-pay | Admitting: Neurology

## 2013-05-07 ENCOUNTER — Encounter (INDEPENDENT_AMBULATORY_CARE_PROVIDER_SITE_OTHER): Payer: Self-pay

## 2013-05-07 VITALS — BP 130/86 | HR 107 | Temp 97.8°F | Ht 65.0 in | Wt 169.0 lb

## 2013-05-07 DIAGNOSIS — M545 Low back pain, unspecified: Secondary | ICD-10-CM

## 2013-05-07 DIAGNOSIS — M5430 Sciatica, unspecified side: Secondary | ICD-10-CM

## 2013-05-07 DIAGNOSIS — M5432 Sciatica, left side: Secondary | ICD-10-CM | POA: Insufficient documentation

## 2013-05-07 HISTORY — DX: Sciatica, left side: M54.32

## 2013-05-07 MED ORDER — NORTRIPTYLINE HCL 25 MG PO CAPS: 50.0000 mg | ORAL_CAPSULE | Freq: Every day | ORAL | Status: DC

## 2013-05-07 NOTE — Progress Notes (Signed)
Reason for visit: Leg pain  Kari Gonzales is a 53 y.o. female  History of present illness:  Kari Gonzales is a 53 year old white female with a history of bipolar disorder. The patient has a history of low back pain that dates back to 2004. The patient has been on disability for the low back pain. Since 2009, the patient began developing a burning sensation on the lateral aspect of the legs below the knee, and the discomfort spread down into the right foot on the lateral aspect, not affecting the medial aspect of the leg. Similar problems then began occurring on the left leg. The patient has a long-standing history of sciatica type pain down the left leg. MRI studies done on the low back did not show an etiology of the pain. The patient indicates that she has had multiple injections of the low back and in the SI joints without much benefit. The patient feels as if there may be some weakness, occasionally she will have collapse of the left knee with an occasional fall. The patient denies any neck symptoms or pain down the arms, but she does note a pressure in the back of the head at times. The patient has some minimal balance issues. The patient denies problems controlling the bladder, but she does have irritable bowel syndrome. The patient gives a history of diabetes, but with weight loss, this issue has improved. The patient has been on low-dose nortriptyline with some benefit, but she currently is only on 10 mg at night. Gabapentin has not helped her, Topamax has not helped her. Thyroid studies and vitamin B12 levels have been checked and are unremarkable.  Past Medical History  Diagnosis Date  . Bipolar disorder   . Panic attacks   . Depression   . Hypothyroid   . Chronic back pain   . Sciatica   . Degenerative disk disease   . Diabetes mellitus   . Diabetic neuropathy   . Lumbar herniated disc     x2  . Osteoporosis   . Coccygeal fracture   . Sciatica of left side 05/07/2013    Past  Surgical History  Procedure Laterality Date  . Tonsillectomy    . Abdominal hysterectomy      partial  . Foot surgery Left     Family History  Problem Relation Age of Onset  . Diabetes Mother   . Osteoarthritis Mother   . Depression Mother   . Depression Father   . Hypertension Father     Social history:  reports that she has been smoking.  She has never used smokeless tobacco. She reports that she drinks about 4.2 ounces of alcohol per week. She reports that she does not use illicit drugs.  Medications:  Current Outpatient Prescriptions on File Prior to Visit  Medication Sig Dispense Refill  . acetaminophen (TYLENOL) 500 MG tablet Take 500 mg by mouth every 6 (six) hours as needed. pain      . ALPRAZolam (XANAX) 1 MG tablet Take 1 mg by mouth 3 (three) times daily as needed. anxiety      . cyclobenzaprine (FLEXERIL) 10 MG tablet Take 10 mg by mouth 3 (three) times daily as needed.      . fluticasone (FLONASE) 50 MCG/ACT nasal spray Place 2 sprays into the nose daily.      Marland Kitchen. ibuprofen (ADVIL,MOTRIN) 800 MG tablet Take 1 tablet (800 mg total) by mouth every 8 (eight) hours as needed for pain. pain  30 tablet  0  . lamoTRIgine (LAMICTAL) 200 MG tablet Take 200 mg by mouth daily.      Marland Kitchen levothyroxine (SYNTHROID, LEVOTHROID) 88 MCG tablet Take 88 mcg by mouth daily.      . [DISCONTINUED] zolpidem (AMBIEN) 10 MG tablet Take 10 mg by mouth at bedtime as needed. Sleep       No current facility-administered medications on file prior to visit.      Allergies  Allergen Reactions  . Sulfonamide Derivatives Anaphylaxis  . Morphine Itching    ROS:  Out of a complete 14 system review of symptoms, the patient complains only of the following symptoms, and all other reviewed systems are negative.  Fatigue Ringing in the ears, difficulty swallowing Birthmark Snoring Constipation Easy bruising Feeling hot, increased thirst Joint pain, achy muscles Allergies Memory loss,  headache Depression, anxiety, not enough sleep, disinterest in activities  Blood pressure 130/86, pulse 107, temperature 97.8 F (36.6 C), temperature source Oral, height 5\' 5"  (1.651 m), weight 169 lb (76.658 kg).  Physical Exam  General: The patient is alert and cooperative at the time of the examination. The patient is minimally to moderately obese.  Eyes: Pupils are equal, round, and reactive to light. Discs are flat bilaterally.  Neck: The neck is supple, no carotid bruits are noted.  Respiratory: The respiratory examination is clear.  Cardiovascular: The cardiovascular examination reveals a regular rate and rhythm, no obvious murmurs or rubs are noted.  Neuromuscular: Range of movement of the low back is relatively full.  Skin: Extremities are without significant edema.  Neurologic Exam  Mental status: The patient is alert and oriented x 3 at the time of the examination. The patient has apparent normal recent and remote memory, with an apparently normal attention span and concentration ability.  Cranial nerves: Facial symmetry is present. There is good sensation of the face to pinprick and soft touch bilaterally. The strength of the facial muscles and the muscles to head turning and shoulder shrug are normal bilaterally. Speech is well enunciated, no aphasia or dysarthria is noted. Extraocular movements are full. Visual fields are full. The tongue is midline, and the patient has symmetric elevation of the soft palate. No obvious hearing deficits are noted.  Motor: The motor testing reveals 5 over 5 strength of all 4 extremities. Good symmetric motor tone is noted throughout. The patient is able to walk on heels and the toes.  Sensory: Sensory testing is intact to pinprick, soft touch, vibration sensation, and position sense on all 4 extremities, with the exception that there is some decrease in vibration sensation and position sensation in the left foot relative to the right. The  patient has extreme hypersensitivity with light touch on the lateral aspect of the right leg below the knee. No evidence of extinction is noted.  Coordination: Cerebellar testing reveals good finger-nose-finger and heel-to-shin bilaterally.  Gait and station: Gait is normal. Tandem gait is normal. Romberg is negative. No drift is seen.  Reflexes: Deep tendon reflexes are symmetric and normal bilaterally. Toes are downgoing bilaterally.   MRI lumbosacral spine 08/25/2012:  IMPRESSION:  1. Interval mild L3-L4 disc degeneration.  2. Otherwise no significant change in the lumbar spine with mild  chronic L1-L2 and L2-L3 disc degeneration eccentric to the left.  No spinal stenosis or convincing neural impingement.    Assessment/Plan:  1. Chronic low back pain  2. Left-sided sciatica  3. Dysesthesias bilateral lower extremities  The patient has hypesthesia in the lateral aspects of the legs  below the knees, with a pattern atypical for true peripheral neuropathy. The pain appears to sound neuropathic in nature however. The patient will be set up for nerve conduction studies of both legs, EMG evaluation of both legs. If these studies are unremarkable, MRI of the thoracic spine may be done. The patient will be increased on the nortriptyline taking 20 mg at night for a week, 30 mg at night per week, and she will then convert to the 25 mg capsules taking 2 at night. The patient will followup for the EMG study. Blood work may be done in the future.  Marlan Palau MD 05/07/2013 7:57 PM  [facility] [address] [place], [place] [address]  Phone [phone] Fax [phone]

## 2013-05-07 NOTE — Patient Instructions (Signed)
   With the nortriptyline, take 10 mg, two at night for one week, then take 3 capsules at night for one week, then convert to the 25 mg capsules.  Pamelor (nortriptyline) is an antidepressant medication that has many uses that may include headache, whiplash injuries, or for peripheral neuropathy pain. Side effects may include drowsiness, dry mouth, blurred vision, or constipation. As with any antidepressant medication, worsening depression may occur. If you had any significant side effects, please call our office. The full effects of this medication may take 7-10 days after starting the drug, or going up on the dose.

## 2013-05-08 ENCOUNTER — Other Ambulatory Visit: Payer: Medicare Other

## 2013-05-17 ENCOUNTER — Ambulatory Visit (INDEPENDENT_AMBULATORY_CARE_PROVIDER_SITE_OTHER): Payer: Medicare Other | Admitting: Neurology

## 2013-05-17 ENCOUNTER — Encounter (INDEPENDENT_AMBULATORY_CARE_PROVIDER_SITE_OTHER): Payer: Self-pay

## 2013-05-17 DIAGNOSIS — M545 Low back pain, unspecified: Secondary | ICD-10-CM

## 2013-05-17 DIAGNOSIS — M5432 Sciatica, left side: Secondary | ICD-10-CM

## 2013-05-17 DIAGNOSIS — D518 Other vitamin B12 deficiency anemias: Secondary | ICD-10-CM

## 2013-05-17 DIAGNOSIS — M79609 Pain in unspecified limb: Secondary | ICD-10-CM

## 2013-05-17 DIAGNOSIS — Z0289 Encounter for other administrative examinations: Secondary | ICD-10-CM

## 2013-05-17 DIAGNOSIS — IMO0001 Reserved for inherently not codable concepts without codable children: Secondary | ICD-10-CM

## 2013-05-17 NOTE — Procedures (Signed)
     HISTORY:  Kari CowperRachel Gonzales is a 53 year old patient with a history of some low back pain, sciatica pain on the left, and a neuralgia type pain below the knees bilaterally. The patient is being evaluated for this neuropathic pain.  NERVE CONDUCTION STUDIES:  Nerve conduction studies were performed on both lower extremities. The distal motor latencies and motor amplitudes for the peroneal and posterior tibial nerves were within normal limits. The nerve conduction velocities for these nerves were also normal. The H reflex latencies were normal. The sensory latencies for the peroneal nerves were within normal limits.    EMG STUDIES:  EMG study was performed on the right lower extremity:  The tibialis anterior muscle reveals 2 to 4K motor units with full recruitment. No fibrillations or positive waves were seen. The peroneus tertius muscle reveals 2 to 4K motor units with full recruitment. No fibrillations or positive waves were seen. The medial gastrocnemius muscle reveals 1 to 3K motor units with full recruitment. No fibrillations or positive waves were seen. The vastus lateralis muscle reveals 2 to 4K motor units with full recruitment. No fibrillations or positive waves were seen. The iliopsoas muscle reveals 2 to 4K motor units with full recruitment. No fibrillations or positive waves were seen. The biceps femoris muscle (long head) reveals 2 to 4K motor units with full recruitment. No fibrillations or positive waves were seen. The lumbosacral paraspinal muscles were tested at 3 levels, and revealed no abnormalities of insertional activity at all 3 levels tested. There was good relaxation.  EMG study was performed on the left lower extremity:  The tibialis anterior muscle reveals 2 to 4K motor units with full recruitment. No fibrillations or positive waves were seen. The peroneus tertius muscle reveals 2 to 4K motor units with full recruitment. No fibrillations or positive waves were  seen. The medial gastrocnemius muscle reveals 1 to 3K motor units with full recruitment. No fibrillations or positive waves were seen. The vastus lateralis muscle reveals 2 to 4K motor units with full recruitment. No fibrillations or positive waves were seen. The iliopsoas muscle reveals 2 to 4K motor units with full recruitment. No fibrillations or positive waves were seen. The biceps femoris muscle (long head) reveals 2 to 4K motor units with full recruitment. No fibrillations or positive waves were seen. The lumbosacral paraspinal muscles were tested at 3 levels, and revealed no abnormalities of insertional activity at all 3 levels tested. There was good relaxation.   IMPRESSION:  Nerve conduction studies done on both lower extremities were unremarkable. No evidence of a peripheral neuropathy is seen. A small fiber neuropathy may be missed by a standard nerve conduction studies, however. Clinical correlation is required. EMG evaluation of both lower extremities was unremarkable, no evidence of a lumbosacral radiculopathy is seen on either side.  Marlan Palau. Keith Sierrah Luevano MD 05/17/2013 2:01 PM  [facility] [address] [place], [place] [id number]  Phone [phone] Fax [phone]

## 2013-05-17 NOTE — Progress Notes (Signed)
[name] is a 53 year old patient with a history of low back pain, sciatic pain on the left, and neuropathic type pain in both legs below the knees. The patient is being evaluated for the discomfort. MRI of the lumbosacral spine did not show an etiology of her current pain syndrome.  Nerve conduction studies on both legs were normal, EMG study of both legs were normal.  The patient will be sent for further blood work today, and she will undergo MRI evaluation of the thoracic spine. The patient will followup in 3 months.

## 2013-05-18 LAB — RHEUMATOID FACTOR: Rhuematoid fact SerPl-aCnc: 8.7 IU/mL (ref 0.0–13.9)

## 2013-05-18 LAB — HIV ANTIBODY (ROUTINE TESTING W REFLEX)
HIV 1/HIV 2 AB: NONREACTIVE
HIV 1/O/2 Abs-Index Value: <1 (ref <1.00)

## 2013-05-18 LAB — LYME, TOTAL AB TEST/REFLEX

## 2013-05-18 LAB — ANGIOTENSIN CONVERTING ENZYME: Angio Convert Enzyme: 23 U/L (ref 14–82)

## 2013-05-18 LAB — SEDIMENTATION RATE: Sed Rate: 5 mm/hr (ref 0–40)

## 2013-05-18 LAB — ANA W/REFLEX: Anti Nuclear Antibody(ANA): NEGATIVE

## 2013-05-18 LAB — VITAMIN B12: Vitamin B-12: 455 pg/mL (ref 211–946)

## 2013-05-21 ENCOUNTER — Telehealth: Payer: Self-pay | Admitting: Neurology

## 2013-05-21 MED ORDER — PREDNISONE 5 MG PO TABS: ORAL_TABLET | ORAL | Status: DC

## 2013-05-21 NOTE — Telephone Encounter (Signed)
Message copied by Stephanie AcreWILLIS, CHARLES on Mon May 21, 2013  9:02 AM ------      Message from: Demetrios LollBARNARD, CATHY C      Created: Mon May 21, 2013  8:34 AM       Called pt to inform her per Dr. Anne HahnWillis that her blood work results were normal and if she has any other problems, questions or concerns to call the office. Pt stated that since she had that procedure done she has been in so much pain and would like for Dr. Anne HahnWillis to give her a call back to discuss what else could be done. Please advise ------

## 2013-05-21 NOTE — Telephone Encounter (Signed)
I called patient. The patient has had an increase in discomfort with left-sided sciatica since the EMG study. The patient is already on amitriptyline, Flexeril, and opiate medications for pain. I'll give a trial on prednisone Dosepak, 5 mg 6 day pack. The patient has been set up for MRI evaluation of the thoracic spine, as the pain symptoms Of the legs has not been explained by the EMG or by the lumbosacral spine MRI.

## 2013-05-23 ENCOUNTER — Telehealth: Payer: Self-pay | Admitting: *Deleted

## 2013-05-23 ENCOUNTER — Ambulatory Visit
Admission: RE | Admit: 2013-05-23 | Discharge: 2013-05-23 | Disposition: A | Discharge status: Home / Self Care | Payer: Medicare Other | Source: Ambulatory Visit | Attending: Neurology | Admitting: Neurology

## 2013-05-23 DIAGNOSIS — M545 Low back pain, unspecified: Secondary | ICD-10-CM

## 2013-05-23 DIAGNOSIS — M5432 Sciatica, left side: Secondary | ICD-10-CM

## 2013-05-23 DIAGNOSIS — IMO0001 Reserved for inherently not codable concepts without codable children: Secondary | ICD-10-CM

## 2013-05-23 DIAGNOSIS — M79609 Pain in unspecified limb: Secondary | ICD-10-CM

## 2013-05-23 DIAGNOSIS — D518 Other vitamin B12 deficiency anemias: Secondary | ICD-10-CM

## 2013-05-23 MED ORDER — GADOBENATE DIMEGLUMINE 529 MG/ML IV SOLN: 15.0000 mL | Freq: Once | INTRAVENOUS | Status: AC | PRN

## 2013-05-23 NOTE — Telephone Encounter (Signed)
Noted, if the patient needs Xanax for the MRI, she can come by and get a packet.

## 2013-05-23 NOTE — Telephone Encounter (Signed)
Pt calling stating that she could not get her MRI done today because of having a panic attack, request to stop taking the prednisone, pt was informed of Xanax and pt stated that her heart races regardless. Please advise

## 2013-05-23 NOTE — Telephone Encounter (Signed)
Dr. Nila NephewWilllis patient wanted to make you aware of MRI being rs to 06/06/13 due to panic attack and she feels the prednisone may have caused the attack...FYI..Thanks

## 2013-05-23 NOTE — Telephone Encounter (Signed)
Patient calling back wanting to know if she could come off of Prednisone.  She tried to have MRI again today, but had another panic attack.  I informed her of last documentation regarding Xanax and she was aware of Xanax calming her down.  She states her heart races regardless.  Please call patient and advise..thanks

## 2013-05-23 NOTE — Telephone Encounter (Signed)
I called patient. The patient had a panic attack during the MRI. The MRI will be rescheduled, she can take a Xanax prior to the scan.

## 2013-05-23 NOTE — Telephone Encounter (Signed)
Pt calling to make Dr. Anne HahnWillis aware that her MRI appt was resch to 06/06/13 due to panic attack and pt states that she thinks that it was the prednisone. FYI

## 2013-05-28 ENCOUNTER — Ambulatory Visit
Admission: RE | Admit: 2013-05-28 | Discharge: 2013-05-28 | Disposition: A | Discharge status: Home / Self Care | Payer: Self-pay | Source: Ambulatory Visit | Attending: Internal Medicine | Admitting: Internal Medicine

## 2013-05-28 DIAGNOSIS — M810 Age-related osteoporosis without current pathological fracture: Secondary | ICD-10-CM

## 2013-06-06 ENCOUNTER — Ambulatory Visit
Admission: RE | Admit: 2013-06-06 | Discharge: 2013-06-06 | Disposition: A | Discharge status: Home / Self Care | Payer: Medicare Other | Source: Ambulatory Visit | Attending: Neurology | Admitting: Neurology

## 2013-06-06 MED ORDER — GADOBENATE DIMEGLUMINE 529 MG/ML IV SOLN
15.0000 mL | Freq: Once | INTRAVENOUS | Status: AC | PRN
  Administered 2013-06-06: 15 mL via INTRAVENOUS

## 2013-06-08 ENCOUNTER — Telehealth: Payer: Self-pay | Admitting: Neurology

## 2013-06-08 NOTE — Telephone Encounter (Signed)
I called patient. The MRI of the thoracic spine is unremarkable. The MRI the low back also does not explain the back pain and sciatica type pain. The patient has sensory changes in the legs, could potentially have a small neuropathy, but the nerve conduction studies were normal, and blood work has been normal. The patient is on nortriptyline at 50 mg at night for pain, also takes oxycodone. The etiology of her discomfort is not completely clear. We may add other medications to her nortriptyline in the future. No indication for surgery.   MRI thoracic spine Jun 07 2013:  Impression   Normal MRI thoracic spine (with and without).

## 2013-06-28 ENCOUNTER — Telehealth: Payer: Self-pay | Admitting: Neurology

## 2013-06-28 MED ORDER — CARBAMAZEPINE 200 MG PO TABS: ORAL_TABLET | ORAL | Status: DC

## 2013-06-28 NOTE — Telephone Encounter (Signed)
Patient called and stated neuropathy pain is unbearable.  Scalp is very tender also.  Requesting an appointment with Dr Anne Hahn asap.  Please call and advise.

## 2013-06-28 NOTE — Telephone Encounter (Signed)
Pt is calling stating that the neuropathy pain is unbearable and wanting to get an appt to see Dr. Anne Hahn asap. Pt has had the EMG done as requested. Wanting to know if you would like for [facility], NP to f/u with this pt. Please advise

## 2013-06-28 NOTE — Telephone Encounter (Signed)
I called patient. The patient continues to have ongoing discomfort. MRI study of the thoracic spine was unremarkable. The patient will be given a trial on carbamazepine. She is on nortriptyline, and she indicates that gabapentin did not help, and Lyrica could not be tolerated secondary to swelling.  The patient did not respond to Topamax, and she currently is on lamotrigine.

## 2013-10-29 ENCOUNTER — Other Ambulatory Visit: Payer: Self-pay | Admitting: Rehabilitation

## 2013-10-29 DIAGNOSIS — M5136 Other intervertebral disc degeneration, lumbar region: Secondary | ICD-10-CM

## 2013-11-07 ENCOUNTER — Ambulatory Visit
Admission: RE | Admit: 2013-11-07 | Discharge: 2013-11-07 | Disposition: A | Discharge status: Home / Self Care | Payer: Medicare Other | Source: Ambulatory Visit | Attending: Rehabilitation | Admitting: Rehabilitation

## 2013-11-07 DIAGNOSIS — M5136 Other intervertebral disc degeneration, lumbar region: Secondary | ICD-10-CM

## 2013-12-03 ENCOUNTER — Encounter: Payer: Self-pay | Admitting: Neurology

## 2014-05-16 ENCOUNTER — Encounter: Payer: Self-pay | Admitting: Neurology

## 2014-05-16 ENCOUNTER — Ambulatory Visit (INDEPENDENT_AMBULATORY_CARE_PROVIDER_SITE_OTHER): Payer: Medicare Other | Admitting: Neurology

## 2014-05-16 VITALS — BP 139/87 | HR 103 | Ht 65.0 in | Wt 161.6 lb

## 2014-05-16 DIAGNOSIS — M5432 Sciatica, left side: Secondary | ICD-10-CM | POA: Diagnosis not present

## 2014-05-16 MED ORDER — CARBAMAZEPINE 200 MG PO TABS: ORAL_TABLET | ORAL | Status: DC

## 2014-05-16 NOTE — Progress Notes (Signed)
Reason for visit: Chronic back pain, left sciatica  Kari Gonzales is an 54 y.o. female  History of present illness:  Kari Gonzales is a 54 year old white female with a history of chronic sciatica pain on the left leg dating back 20 years or more. The patient has had some increase in pain recently, with discomfort with sitting. The patient has had MRI evaluation of the low back that shows minimal disc degeneration, no evidence of nerve root compression. The patient has undergone multiple therapies with injections in the low back, SI joint, and in the hip. The patient has had injections in the sciatic nerve without benefit. The patient has had TENS unit trials, chiropractor treatments, and acupuncture without benefit. The patient is on oxycodone for discomfort. She has been tried on Lyrica, gabapentin, Cymbalta, nortriptyline, and fentanyl patch in the past. These medications have not been beneficial. The patient is trying to go back to work, only able to work part-time. She has difficulty sitting for prolonged periods of time. MRI of the pelvis and left hip were unremarkable. She had MRI of the thoracic spine that was unremarkable. She has developed hypersensitivity in the peroneal nerve distributions bilaterally. She has pain with light touch of the skin on the legs. She has discomfort in the feet. Prior nerve conduction studies and EMG evaluation were unremarkable. The patient does have a history of borderline diabetes.  Past Medical History  Diagnosis Date  . Bipolar disorder   . Panic attacks   . Depression   . Hypothyroid   . Chronic back pain   . Sciatica   . Degenerative disk disease   . Diabetes mellitus   . Diabetic neuropathy   . Lumbar herniated disc     x2  . Osteoporosis   . Coccygeal fracture   . Sciatica of left side 05/07/2013    Past Surgical History  Procedure Laterality Date  . Tonsillectomy    . Abdominal hysterectomy      partial  . Foot surgery Left     Family  History  Problem Relation Age of Onset  . Diabetes Mother   . Osteoarthritis Mother   . Depression Mother   . Raynaud syndrome Mother   . Hypertension Mother   . Cancer Mother     uterine  . COPD Mother   . Depression Father   . Hypertension Father   . Hypotension Father   . Cancer Maternal Grandmother     uterine    Social history:  reports that she has been smoking.  She has never used smokeless tobacco. She reports that she drinks alcohol. She reports that she does not use illicit drugs.    Allergies  Allergen Reactions  . Sulfonamide Derivatives Anaphylaxis  . Morphine Itching  . Prednisone     Heart races  . Aspirin Rash  . Ibuprofen Rash    Medications:  Prior to Admission medications   Medication Sig Start Date End Date Taking? Authorizing Provider  ALPRAZolam Prudy Feeler) 1 MG tablet Take 1 mg by mouth 3 (three) times daily as needed. anxiety   Yes Historical Provider, MD  amphetamine-dextroamphetamine (ADDERALL) 30 MG tablet Take 1 tablet by mouth 2 (two) times daily. 04/07/13  Yes Historical Provider, MD  carisoprodol (SOMA) 350 MG tablet Take 350 mg by mouth 4 (four) times daily as needed for muscle spasms.   Yes Historical Provider, MD  eszopiclone (LUNESTA) 1 MG TABS tablet Take 1 mg by mouth at bedtime as needed for  sleep. Take immediately before bedtime   Yes Historical Provider, MD  fluticasone (FLONASE) 50 MCG/ACT nasal spray Place 2 sprays into the nose daily.   Yes Historical Provider, MD  LamoTRIgine 50 MG TB24 Take 50 mg by mouth daily.   Yes Historical Provider, MD  levothyroxine (SYNTHROID, LEVOTHROID) 88 MCG tablet Take 88 mcg by mouth daily.   Yes Historical Provider, MD  Oxycodone HCl 10 MG TABS Take 10 mg by mouth 4 (four) times daily as needed.   Yes Historical Provider, MD  albuterol (PROVENTIL) (2.5 MG/3ML) 0.083% nebulizer solution Take by nebulization 3 (three) times daily. 04/23/14   Historical Provider, MD  carbamazepine (TEGRETOL) 200 MG tablet 1/2  tablet twice a day for 2 weeks, then take 1 tablet twice a day 05/16/14   York Spanielharles K Kaithlyn Teagle, MD    ROS:  Out of a complete 14 system review of symptoms, the patient complains only of the following symptoms, and all other reviewed systems are negative.  Weight loss, fatigue Ringing in the ears, difficulty swallowing Skin rash, birthmarks, itching Eye pain Cough, wheezing, snoring Incontinence, constipation Easy bruising, lymph node swelling Feeling hot, increased thirst, flushing Joint pain, joint swelling, muscle cramps, aching muscles Allergies, skin sensitivity Memory loss, headache, numbness, weakness, difficulty swallowing, tremor Depression, anxiety, not enough sleep, decreased energy, change in appetite, disinterest in activities, racing thoughts Insomnia, sleepiness, restless legs  Blood pressure 139/87, pulse 103, height 5\' 5"  (1.651 m), weight 161 lb 9.6 oz (73.301 kg).  Physical Exam  General: The patient is alert and cooperative at the time of the examination. The patient is minimally obese.  Neuromuscular: The patient is able to flex to about 100 with the low back. Some pain is noted over the left SI joint.  Skin: No significant peripheral edema is noted.   Neurologic Exam  Mental status: The patient is alert and oriented x 3 at the time of the examination. The patient has apparent normal recent and remote memory, with an apparently normal attention span and concentration ability.   Cranial nerves: Facial symmetry is present. Speech is normal, no aphasia or dysarthria is noted. Extraocular movements are full. Visual fields are full.  Motor: The patient has good strength in all 4 extremities.  Sensory examination: Soft touch sensation is symmetric on the face, arms, and legs.  Coordination: The patient has good finger-nose-finger and heel-to-shin bilaterally.  Gait and station: The patient has a normal gait. Tandem gait is normal. Romberg is negative. No drift  is seen.  Reflexes: Deep tendon reflexes are symmetric.   Assessment/Plan:  1. Chronic back pain, left sided sciatica  2. Neuropathic pain, bilateral lower extremities  The patient had a relatively normal workup looking at the anatomy of the thoracic spine, lumbosacral spine, and left hip. EMG nerve conduction studies have been unremarkable with the legs. The etiology of her pain is not clear. The patient has not responded to multiple medication therapies, physical therapy, chiropractic treatments, TENS unit trials, and steroid injections of the back and hip area. I have recommended lumbar traction, and consideration of a spinal stimulator. The patient was placed on carbamazepine previously, but she does not remember taking medication. We will give the carbamazepine another trial. She will follow-up in about 4 months.  Marlan Palau. Keith Lavontay Kirk MD 05/16/2014 8:39 PM  [facility] [address] [place], [place] [id number]  Phone [phone] Fax [phone]

## 2014-05-16 NOTE — Patient Instructions (Signed)
Back Pain, Adult Low back pain is very common. About 1 in 5 people have back pain.The cause of low back pain is rarely dangerous. The pain often gets better over time.About half of people with a sudden onset of back pain feel better in just 2 weeks. About 8 in 10 people feel better by 6 weeks.  CAUSES Some common causes of back pain include:  Strain of the muscles or ligaments supporting the spine.  Wear and tear (degeneration) of the spinal discs.  Arthritis.  Direct injury to the back. DIAGNOSIS Most of the time, the direct cause of low back pain is not known.However, back pain can be treated effectively even when the exact cause of the pain is unknown.Answering your caregiver's questions about your overall health and symptoms is one of the most accurate ways to make sure the cause of your pain is not dangerous. If your caregiver needs more information, he or she may order lab work or imaging tests (X-rays or MRIs).However, even if imaging tests show changes in your back, this usually does not require surgery. HOME CARE INSTRUCTIONS For many people, back pain returns.Since low back pain is rarely dangerous, it is often a condition that people can learn to manageon their own.   Remain active. It is stressful on the back to sit or stand in one place. Do not sit, drive, or stand in one place for more than 30 minutes at a time. Take short walks on level surfaces as soon as pain allows.Try to increase the length of time you walk each day.  Do not stay in bed.Resting more than 1 or 2 days can delay your recovery.  Do not avoid exercise or work.Your body is made to move.It is not dangerous to be active, even though your back may hurt.Your back will likely heal faster if you return to being active before your pain is gone.  Pay attention to your body when you bend and lift. Many people have less discomfortwhen lifting if they bend their knees, keep the load close to their bodies,and  avoid twisting. Often, the most comfortable positions are those that put less stress on your recovering back.  Find a comfortable position to sleep. Use a firm mattress and lie on your side with your knees slightly bent. If you lie on your back, put a pillow under your knees.  Only take over-the-counter or prescription medicines as directed by your caregiver. Over-the-counter medicines to reduce pain and inflammation are often the most helpful.Your caregiver may prescribe muscle relaxant drugs.These medicines help dull your pain so you can more quickly return to your normal activities and healthy exercise.  Put ice on the injured area.  Put ice in a plastic bag.  Place a towel between your skin and the bag.  Leave the ice on for 15-20 minutes, 03-04 times a day for the first 2 to 3 days. After that, ice and heat may be alternated to reduce pain and spasms.  Ask your caregiver about trying back exercises and gentle massage. This may be of some benefit.  Avoid feeling anxious or stressed.Stress increases muscle tension and can worsen back pain.It is important to recognize when you are anxious or stressed and learn ways to manage it.Exercise is a great option. SEEK MEDICAL CARE IF:  You have pain that is not relieved with rest or medicine.  You have pain that does not improve in 1 week.  You have new symptoms.  You are generally not feeling well. SEEK   IMMEDIATE MEDICAL CARE IF:   You have pain that radiates from your back into your legs.  You develop new bowel or bladder control problems.  You have unusual weakness or numbness in your arms or legs.  You develop nausea or vomiting.  You develop abdominal pain.  You feel faint. Document Released: 01/18/2005 Document Revised: 07/20/2011 Document Reviewed: 05/22/2013 ExitCare Patient Information 2015 [employer]. This information is not intended to replace advice given to you by your health care provider. Make sure you  discuss any questions you have with your health care provider.  

## 2014-06-24 ENCOUNTER — Encounter (HOSPITAL_COMMUNITY): Payer: Self-pay | Admitting: Radiology

## 2014-06-24 ENCOUNTER — Emergency Department (HOSPITAL_COMMUNITY): Payer: Medicare Other

## 2014-06-24 ENCOUNTER — Emergency Department (HOSPITAL_COMMUNITY)
Admission: EM | Admit: 2014-06-24 | Discharge: 2014-06-24 | Disposition: A | Discharge status: Home / Self Care | Payer: Medicare Other | Attending: Emergency Medicine | Admitting: Emergency Medicine

## 2014-06-24 DIAGNOSIS — S299XXA Unspecified injury of thorax, initial encounter: Secondary | ICD-10-CM | POA: Diagnosis present

## 2014-06-24 DIAGNOSIS — G8929 Other chronic pain: Secondary | ICD-10-CM | POA: Diagnosis not present

## 2014-06-24 DIAGNOSIS — Y9389 Activity, other specified: Secondary | ICD-10-CM | POA: Diagnosis not present

## 2014-06-24 DIAGNOSIS — F419 Anxiety disorder, unspecified: Secondary | ICD-10-CM | POA: Insufficient documentation

## 2014-06-24 DIAGNOSIS — Z7951 Long term (current) use of inhaled steroids: Secondary | ICD-10-CM | POA: Insufficient documentation

## 2014-06-24 DIAGNOSIS — W01198A Fall on same level from slipping, tripping and stumbling with subsequent striking against other object, initial encounter: Secondary | ICD-10-CM | POA: Insufficient documentation

## 2014-06-24 DIAGNOSIS — E1140 Type 2 diabetes mellitus with diabetic neuropathy, unspecified: Secondary | ICD-10-CM | POA: Insufficient documentation

## 2014-06-24 DIAGNOSIS — Z8739 Personal history of other diseases of the musculoskeletal system and connective tissue: Secondary | ICD-10-CM | POA: Diagnosis not present

## 2014-06-24 DIAGNOSIS — Z79899 Other long term (current) drug therapy: Secondary | ICD-10-CM | POA: Diagnosis not present

## 2014-06-24 DIAGNOSIS — Z720 Tobacco use: Secondary | ICD-10-CM | POA: Insufficient documentation

## 2014-06-24 DIAGNOSIS — F319 Bipolar disorder, unspecified: Secondary | ICD-10-CM | POA: Diagnosis not present

## 2014-06-24 DIAGNOSIS — E039 Hypothyroidism, unspecified: Secondary | ICD-10-CM | POA: Insufficient documentation

## 2014-06-24 DIAGNOSIS — S298XXA Other specified injuries of thorax, initial encounter: Secondary | ICD-10-CM

## 2014-06-24 DIAGNOSIS — Y998 Other external cause status: Secondary | ICD-10-CM | POA: Diagnosis not present

## 2014-06-24 DIAGNOSIS — Y9289 Other specified places as the place of occurrence of the external cause: Secondary | ICD-10-CM | POA: Diagnosis not present

## 2014-06-24 DIAGNOSIS — S20211A Contusion of right front wall of thorax, initial encounter: Secondary | ICD-10-CM | POA: Diagnosis not present

## 2014-06-24 DIAGNOSIS — R0789 Other chest pain: Secondary | ICD-10-CM

## 2014-06-24 MED ORDER — ONDANSETRON 4 MG PO TBDP
4.0000 mg | ORAL_TABLET | Freq: Once | ORAL | Status: AC
  Administered 2014-06-24: 4 mg via ORAL
  Filled 2014-06-24: qty 1

## 2014-06-24 MED ORDER — HYDROMORPHONE HCL 1 MG/ML IJ SOLN
1.0000 mg | Freq: Once | INTRAMUSCULAR | Status: AC
  Administered 2014-06-24: 1 mg via INTRAMUSCULAR
  Filled 2014-06-24: qty 1

## 2014-06-24 NOTE — ED Notes (Signed)
Patient transported to Ultrasound 

## 2014-06-24 NOTE — ED Provider Notes (Signed)
CSN: [id number]     Arrival date & time 06/24/14  0945 History   First MD Initiated Contact with Patient 06/24/14 1002     Chief Complaint  Patient presents with  . Fall     (Consider location/radiation/quality/duration/timing/severity/associated sxs/prior Treatment) HPI Comments: Patient states Friday night she was walking and she has issues with sciatic problems of her leg and her leg gave out causing her to fall against a door jam on her right rib area. She was having some pain in that area until last night she ate chips and dip which often will cause heartburn and vomiting. They upset her stomach and after she had an episode of vomiting the pain in the right ribs became severe. She went to her doctor's office today because she felt like she needed an x-ray to make sure she didn't have a broken rib and she was sent here for further evaluation. She denies any shortness of breath. She has no abdominal pain nausea or vomiting at this time. She felt the nausea and vomiting was only related to what she ate and it is now gone. She did take a Xanax and oxycodone approximately 1 hour prior to arrival but is still having significant pain.  Patient is a 54 y.o. female presenting with fall. The history is provided by the patient.  Fall This is a new problem. Episode onset: 3 days ago. The problem occurs constantly. The problem has been gradually worsening. Associated symptoms include chest pain. Pertinent negatives include no abdominal pain and no shortness of breath. The symptoms are aggravated by sneezing and coughing (deep breathing, vomiting). The symptoms are relieved by narcotics. The treatment provided no relief.    Past Medical History  Diagnosis Date  . Bipolar disorder   . Panic attacks   . Depression   . Hypothyroid   . Chronic back pain   . Sciatica   . Degenerative disk disease   . Diabetes mellitus   . Diabetic neuropathy   . Lumbar herniated disc     x2  . Osteoporosis   .  Coccygeal fracture   . Sciatica of left side 05/07/2013   Past Surgical History  Procedure Laterality Date  . Tonsillectomy    . Abdominal hysterectomy      partial  . Foot surgery Left    Family History  Problem Relation Age of Onset  . Diabetes Mother   . Osteoarthritis Mother   . Depression Mother   . Raynaud syndrome Mother   . Hypertension Mother   . Cancer Mother     uterine  . COPD Mother   . Depression Father   . Hypertension Father   . Hypotension Father   . Cancer Maternal Grandmother     uterine   History  Substance Use Topics  . Smoking status: Current Every Day Smoker -- 1.00 packs/day  . Smokeless tobacco: Never Used  . Alcohol Use: Yes     Comment: occasional   OB History    Gravida Para Term Preterm AB TAB SAB Ectopic Multiple Living   Review of Systems  Respiratory: Negative for shortness of breath.   Cardiovascular: Positive for chest pain.  Gastrointestinal: Negative for abdominal pain.  All other systems reviewed and are negative.     Allergies  Sulfonamide derivatives; Morphine; Prednisone; Aspirin; and Ibuprofen  Home Medications   Prior to Admission medications   Medication Sig Start Date  End Date Taking? Authorizing Provider  albuterol (PROVENTIL) (2.5 MG/3ML) 0.083% nebulizer solution Take by nebulization 3 (three) times daily. 04/23/14   Historical Provider, MD  ALPRAZolam Prudy Feeler) 1 MG tablet Take 1 mg by mouth 3 (three) times daily as needed. anxiety    Historical Provider, MD  amphetamine-dextroamphetamine (ADDERALL) 30 MG tablet Take 1 tablet by mouth 2 (two) times daily. 04/07/13   Historical Provider, MD  carbamazepine (TEGRETOL) 200 MG tablet 1/2 tablet twice a day for 2 weeks, then take 1 tablet twice a day 05/16/14   York Spaniel, MD  carisoprodol (SOMA) 350 MG tablet Take 350 mg by mouth 4 (four) times daily as needed for muscle spasms.    Historical Provider, MD  eszopiclone (LUNESTA) 1 MG TABS tablet Take 1  mg by mouth at bedtime as needed for sleep. Take immediately before bedtime    Historical Provider, MD  fluticasone (FLONASE) 50 MCG/ACT nasal spray Place 2 sprays into the nose daily.    Historical Provider, MD  LamoTRIgine 50 MG TB24 Take 50 mg by mouth daily.    Historical Provider, MD  levothyroxine (SYNTHROID, LEVOTHROID) 88 MCG tablet Take 88 mcg by mouth daily.    Historical Provider, MD  Oxycodone HCl 10 MG TABS Take 10 mg by mouth 4 (four) times daily as needed.    Historical Provider, MD   BP 141/89 mmHg  Pulse 89  Temp(Src) 98.1 F (36.7 C) (Oral)  Resp 20  SpO2 99% Physical Exam  Constitutional: She is oriented to person, place, and time. She appears well-developed and well-nourished. No distress.  HENT:  Head: Normocephalic and atraumatic.  Mouth/Throat: Oropharynx is clear and moist.  Eyes: Conjunctivae and EOM are normal. Pupils are equal, round, and reactive to light.  Neck: Normal range of motion. Neck supple.  Cardiovascular: Normal rate, regular rhythm and intact distal pulses.   No murmur heard. Pulmonary/Chest: Effort normal and breath sounds normal. No respiratory distress. She has no wheezes. She has no rales. She exhibits tenderness and bony tenderness. She exhibits no crepitus, no edema, no deformity and no swelling. Right breast exhibits no mass, no skin change and no tenderness.    Abdominal: Soft. She exhibits no distension. There is no tenderness. There is no rebound, no guarding and no CVA tenderness.  Musculoskeletal: Normal range of motion. She exhibits no edema or tenderness.  Neurological: She is alert and oriented to person, place, and time.  Skin: Skin is warm and dry. No rash noted. No erythema.  Psychiatric: She has a normal mood and affect. Her behavior is normal.  Nursing note and vitals reviewed.   ED Course  Procedures (including critical care time) Labs Review Labs Reviewed - No data to display  Imaging Review Dg Ribs Unilateral W/chest  Right  06/24/2014   CLINICAL DATA:  Pain following fall 3 days prior  EXAM: RIGHT RIBS AND CHEST - 3+ VIEW  COMPARISON:  Chest radiograph Jun 10, 2008  FINDINGS: Frontal, oblique, and cone-down lower rib images were obtained. Lungs are clear. Heart size and pulmonary vascularity are normal. No adenopathy. No pneumothorax or effusion. No rib fractures apparent.  IMPRESSION: No apparent rib fractures.  Lungs clear.   Electronically Signed   By: Bretta Bang III M.D.   On: 06/24/2014 11:28     EKG Interpretation None      MDM   Final diagnoses:  Chest wall pain  Rib contusion, right, initial encounter    Patient presents from PMD office due  to persistent worsening pain in the right rib area after a fall 3 days ago. Patient has pain with taking a deep breath but denies shortness of breath, abdominal pain, syncope. She takes no anticoagulation. The fall was caused because her leg gave out from her sciatica which she states happens intermittently.  On exam she has significant anterior right rib pain localized to the lower ribs. No evidence of breast trauma. No flank pain, abdominal pain and normal heart and lung exam. Low suspicion for pneumothorax at this time however concern for rib fracture.  Patient takes oxycodone tens at home 4 times a day but she did take 1 prior to arrival. She has been able to ambulate without difficulty. No head injury or LOC. Patient given additional pain control as well as plain films pending  11:36 AM CXR without acute findings.  Pt d/ced home.  Gwyneth SproutWhitney Rukia Mcgillivray, MD 06/24/14 [phone]

## 2014-06-24 NOTE — ED Notes (Signed)
Pt presents from PMD office. Pt has a history of sciatica and states "that her leg gave out" and she  fell standing on Friday hitting the door jam. Pt PMD sent pt to ED. Pt reports pain to right flank.

## 2014-06-24 NOTE — Discharge Instructions (Signed)

## 2014-06-24 NOTE — ED Notes (Signed)
Patient transported to X-ray 

## 2014-07-05 ENCOUNTER — Ambulatory Visit: Payer: Medicare Other | Admitting: Podiatry

## 2014-07-15 ENCOUNTER — Ambulatory Visit: Payer: Medicare Other | Admitting: Podiatry

## 2014-07-25 ENCOUNTER — Ambulatory Visit: Payer: Medicare Other | Admitting: Podiatry

## 2014-09-20 ENCOUNTER — Ambulatory Visit: Payer: Medicare Other | Admitting: Neurology

## 2015-05-13 ENCOUNTER — Other Ambulatory Visit: Payer: Self-pay | Admitting: Sports Medicine

## 2015-05-13 DIAGNOSIS — S82821D Torus fracture of lower end of right fibula, subsequent encounter for fracture with routine healing: Secondary | ICD-10-CM

## 2015-05-21 ENCOUNTER — Other Ambulatory Visit: Payer: Self-pay | Admitting: Sports Medicine

## 2015-05-21 ENCOUNTER — Ambulatory Visit
Admission: RE | Admit: 2015-05-21 | Discharge: 2015-05-21 | Disposition: A | Discharge status: Home / Self Care | Payer: Medicare Other | Source: Ambulatory Visit | Attending: Sports Medicine | Admitting: Sports Medicine

## 2015-05-21 DIAGNOSIS — S82821D Torus fracture of lower end of right fibula, subsequent encounter for fracture with routine healing: Secondary | ICD-10-CM

## 2015-11-20 ENCOUNTER — Telehealth: Payer: Self-pay | Admitting: Neurology

## 2015-11-20 NOTE — Telephone Encounter (Signed)
Pt called said she fx rt fibula above the ankle on 12/13/14 and was put in a soft cast with a boot until the middle of January 2017. She fx it again in 2/17 but did not realize it was fx until she saw orthopaedic Dr in 3/17 and was put in a hard cast. It was taken off 3 wks later. During the 3 weeks she felt the cast was rubbing the ankle. When the cast was taken off she "felt a crunch at the ankle" and screamed that it hurt. She said since the hard cast has been taken off she has experienced swelling, redness of the foot with a ring around the ankle in the morning and it is hot to the touch and it stays this way most of the time. She said the ankle is very painful. She said she has been dx with chronic pain syndrome.  This is new symptom and an appt has been scheduled for 10/24 @ 10:30. She said she trust Dr Lacretia NicksW opinion. Pt was advised RN would call if there were any questions.

## 2015-11-25 ENCOUNTER — Ambulatory Visit: Payer: Medicare Other | Admitting: Neurology

## 2016-01-20 ENCOUNTER — Encounter: Payer: Self-pay | Admitting: Gynecology

## 2016-01-20 ENCOUNTER — Ambulatory Visit (INDEPENDENT_AMBULATORY_CARE_PROVIDER_SITE_OTHER): Payer: Medicare Other | Admitting: Gynecology

## 2016-01-20 VITALS — BP 130/82 | Ht 65.0 in | Wt 168.0 lb

## 2016-01-20 DIAGNOSIS — Z01411 Encounter for gynecological examination (general) (routine) with abnormal findings: Secondary | ICD-10-CM | POA: Diagnosis not present

## 2016-01-20 DIAGNOSIS — Z8639 Personal history of other endocrine, nutritional and metabolic disease: Secondary | ICD-10-CM | POA: Diagnosis not present

## 2016-01-20 DIAGNOSIS — E663 Overweight: Secondary | ICD-10-CM

## 2016-01-20 DIAGNOSIS — Z8371 Family history of colonic polyps: Secondary | ICD-10-CM

## 2016-01-20 DIAGNOSIS — Z780 Asymptomatic menopausal state: Secondary | ICD-10-CM | POA: Diagnosis not present

## 2016-01-20 DIAGNOSIS — F17200 Nicotine dependence, unspecified, uncomplicated: Secondary | ICD-10-CM | POA: Diagnosis not present

## 2016-01-20 DIAGNOSIS — M810 Age-related osteoporosis without current pathological fracture: Secondary | ICD-10-CM | POA: Diagnosis not present

## 2016-01-20 NOTE — Progress Notes (Signed)
Kari CowperRachel Schunk 03/12/1960 [id number]   History:    55 y.o.  for annual gyn exam she has on and off lower abdominal discomfort she states in the past after over she has had ovarian cysts. Patient stated that back in 2003 she had a laparoscopic-assisted vaginal hysterectomy as a result of dysfunction uterine bleeding. She also mentioned that she had premature menopause at the age of [age] and began bleeding at the age of 55 and shortly thereafter is when she had her hysterectomy. She also smokes half a pack cigarette per day since she was [age] and states she has history of osteoporosis but cannot take any oral anti-resorptive agent because of her gastroesophageal reflux. She also has mentioned that she's had history vitamin D deficiency and has been treated but currently not taking any medication. She does have history of hypothyroidism and type 1 diabetes that her PCP is following. Patient stated her last colonoscopy was in 2009 her mother had history of colon polyps. Patient reports no history of abnormal Pap smear requiring any form of treatment in the past.  Bone density study in 2015 demonstrated the lowest T score was -2.8 the [facility] and the right femoral neck -2.5 indicating osteoporosis.  Past medical history,surgical history, family history and social history were all reviewed and documented in the EPIC chart.  Gynecologic History No LMP recorded. Patient has had a hysterectomy. Contraception: status post hysterectomy Last Pap: 2003. Results were: normal Last mammogram: 2014. Results were: Normal but dense  Obstetric History OB History  Gravida Para Term Preterm AB Living  4 1     3 1   SAB TAB Ectopic Multiple Live Births      0        # Outcome Date GA Lbr Len/2nd Weight Sex Delivery Anes PTL Lv  4 AB           3 AB           2 AB           1 Para                ROS: A ROS was performed and pertinent positives and negatives are included in the history.  GENERAL: No fevers or  chills. HEENT: No change in vision, no earache, sore throat or sinus congestion. NECK: No pain or stiffness. CARDIOVASCULAR: No chest pain or pressure. No palpitations. PULMONARY: No shortness of breath, cough or wheeze. GASTROINTESTINAL: No abdominal pain, nausea, vomiting or diarrhea, melena or bright red blood per rectum. GENITOURINARY: No urinary frequency, urgency, hesitancy or dysuria. MUSCULOSKELETAL: No joint or muscle pain, no back pain, no recent trauma. DERMATOLOGIC: No rash, no itching, no lesions. ENDOCRINE: No polyuria, polydipsia, no heat or cold intolerance. No recent change in weight. HEMATOLOGICAL: No anemia or easy bruising or bleeding. NEUROLOGIC: No headache, seizures, numbness, tingling or weakness. PSYCHIATRIC: No depression, no loss of interest in normal activity or change in sleep pattern.     Exam: chaperone present  BP 130/82   Ht 5\' 5"  (1.651 m)   Wt 168 lb (76.2 kg)   BMI 27.96 kg/m   Body mass index is 27.96 kg/m.  General appearance : Well developed well nourished female. No acute distress HEENT: Eyes: no retinal hemorrhage or exudates,  Neck supple, trachea midline, no carotid bruits, no thyroidmegaly Lungs: Clear to auscultation, no rhonchi or wheezes, or rib retractions  Heart: Regular rate and rhythm, no murmurs or gallops Breast:Examined in sitting and supine  position were symmetrical in appearance, no palpable masses or tenderness,  no skin retraction, no nipple inversion, no nipple discharge, no skin discoloration, no axillary or supraclavicular lymphadenopathy Abdomen: no palpable masses or tenderness, no rebound or guarding Extremities: no edema or skin discoloration or tenderness  Pelvic:  Bartholin, Urethra, Skene Glands: Within normal limits             Vagina: No gross lesions or discharge  Cervix: Absent  Uterus  absent  Adnexa  Without masses or tenderness  Anus and perineum  normal   Rectovaginal  normal sphincter tone without palpated  masses or tenderness             Hemoccult cards provided     Assessment/Plan:  55 y.o. female for annual exam with history of osteoporosis currently on no medication history of GERD. We'll schedule bone density study and an appointment to see me in consultation. Because her history vitamin D deficiency will check a vitamin D level today. We are going to do a Pap smear today just to have a baseline for us that she will no longer need them since she's had a hysterectomy. She was counseled on the detrimental effects of smoking. She was reminded to schedule an appointment with her gastroenterologist for her overdue colonoscopy. I provided her with fecal Hemoccult cards for testing. Patient declined flu vaccine. Patient was also provided with requisition to schedule her mammogram as well.  An additional 20 minutes was spent going over her past medical history counseling patient on the detrimental effects of smoking also on ovarian cyst, menopause, osteoporosis and vitamin D deficiency and recommended treatments and follow-up. Instructions were provided in written format.   Ok EdwardsFERNANDEZ,JUAN H MD, 2:38 PM 01/20/2016

## 2016-01-20 NOTE — Addendum Note (Signed)
Addended by: Kem ParkinsonBARNES, Devesh Monforte on: 01/20/2016 02:57 PM   Modules accepted: Orders

## 2016-01-21 ENCOUNTER — Other Ambulatory Visit: Payer: Self-pay | Admitting: Gynecology

## 2016-01-21 ENCOUNTER — Encounter: Payer: Self-pay | Admitting: Gynecology

## 2016-01-21 DIAGNOSIS — E559 Vitamin D deficiency, unspecified: Secondary | ICD-10-CM

## 2016-01-21 LAB — VITAMIN D 25 HYDROXY (VIT D DEFICIENCY, FRACTURES): VIT D 25 HYDROXY: 21 ng/mL — AB (ref 30–100)

## 2016-01-21 LAB — PAP IG W/ RFLX HPV ASCU

## 2016-01-21 MED ORDER — VITAMIN D (ERGOCALCIFEROL) 1.25 MG (50000 UNIT) PO CAPS: 50000.0000 [IU] | ORAL_CAPSULE | ORAL | 0 refills | Status: DC

## 2016-02-04 ENCOUNTER — Other Ambulatory Visit: Payer: Self-pay | Admitting: Internal Medicine

## 2016-02-04 DIAGNOSIS — R5381 Other malaise: Secondary | ICD-10-CM

## 2016-02-04 DIAGNOSIS — Z1231 Encounter for screening mammogram for malignant neoplasm of breast: Secondary | ICD-10-CM

## 2016-03-04 ENCOUNTER — Other Ambulatory Visit: Payer: Self-pay | Admitting: Anesthesiology

## 2016-03-04 DIAGNOSIS — Z1211 Encounter for screening for malignant neoplasm of colon: Secondary | ICD-10-CM

## 2016-03-08 ENCOUNTER — Other Ambulatory Visit: Payer: Self-pay | Admitting: Internal Medicine

## 2016-03-08 DIAGNOSIS — M810 Age-related osteoporosis without current pathological fracture: Secondary | ICD-10-CM

## 2016-03-15 ENCOUNTER — Encounter: Payer: Self-pay | Admitting: Anesthesiology

## 2016-04-06 ENCOUNTER — Ambulatory Visit
Admission: RE | Admit: 2016-04-06 | Discharge: 2016-04-06 | Disposition: A | Discharge status: Home / Self Care | Payer: Medicare Other | Source: Ambulatory Visit | Attending: Internal Medicine | Admitting: Internal Medicine

## 2016-04-06 DIAGNOSIS — Z1231 Encounter for screening mammogram for malignant neoplasm of breast: Secondary | ICD-10-CM

## 2016-04-06 DIAGNOSIS — M810 Age-related osteoporosis without current pathological fracture: Secondary | ICD-10-CM

## 2016-04-13 ENCOUNTER — Other Ambulatory Visit: Payer: Self-pay | Admitting: Gynecology

## 2016-04-13 ENCOUNTER — Telehealth: Payer: Self-pay

## 2016-04-13 DIAGNOSIS — N951 Menopausal and female climacteric states: Secondary | ICD-10-CM

## 2016-04-13 NOTE — Telephone Encounter (Signed)
Order placed

## 2016-04-13 NOTE — Telephone Encounter (Signed)
Yes

## 2016-04-13 NOTE — Telephone Encounter (Signed)
Patient is coming Thursday for recheck on her Vitamin D level and [facility] Ok?

## 2016-04-15 ENCOUNTER — Other Ambulatory Visit: Payer: Medicare Other

## 2016-04-15 ENCOUNTER — Other Ambulatory Visit: Payer: Self-pay

## 2016-04-16 ENCOUNTER — Telehealth: Payer: Self-pay | Admitting: *Deleted

## 2016-04-16 NOTE — Telephone Encounter (Signed)
-----   Message from Ok EdwardsJuan H Fernandez, MD sent at 04/13/2016 12:49 PM EDT ----- Please inform patient I reviewed her bone density study which indicates she has osteoporosis when she comes on March 19 to check her vitamin D level she will need an appointment for consultation to discuss treatment for osteoporosis ----- Message ----- From: Shiela MayerMiriam Castillo Sent: 04/13/2016  12:29 PM To: Ok EdwardsJuan H Fernandez, MD

## 2016-04-16 NOTE — Telephone Encounter (Signed)
Pt aware, will schedule OV when she comes in to have vitamin d level drawn

## 2016-04-17 ENCOUNTER — Other Ambulatory Visit: Payer: Self-pay | Admitting: Gynecology

## 2016-04-19 ENCOUNTER — Other Ambulatory Visit: Payer: Medicare Other

## 2016-04-19 DIAGNOSIS — N951 Menopausal and female climacteric states: Secondary | ICD-10-CM

## 2016-04-19 DIAGNOSIS — E559 Vitamin D deficiency, unspecified: Secondary | ICD-10-CM

## 2016-04-20 ENCOUNTER — Other Ambulatory Visit: Payer: Self-pay | Admitting: Gynecology

## 2016-04-20 DIAGNOSIS — E559 Vitamin D deficiency, unspecified: Secondary | ICD-10-CM

## 2016-04-20 LAB — FOLLICLE STIMULATING HORMONE: FSH: 135.7 m[IU]/mL — AB

## 2016-04-20 LAB — VITAMIN D 25 HYDROXY (VIT D DEFICIENCY, FRACTURES): Vit D, 25-Hydroxy: 27 ng/mL — ABNORMAL LOW (ref 30–100)

## 2016-04-20 MED ORDER — VITAMIN D (ERGOCALCIFEROL) 1.25 MG (50000 UNIT) PO CAPS: 50000.0000 [IU] | ORAL_CAPSULE | ORAL | 0 refills | Status: DC

## 2016-04-27 ENCOUNTER — Institutional Professional Consult (permissible substitution): Payer: Medicare Other | Admitting: Gynecology

## 2016-04-27 DIAGNOSIS — Z0289 Encounter for other administrative examinations: Secondary | ICD-10-CM

## 2016-06-03 ENCOUNTER — Ambulatory Visit (INDEPENDENT_AMBULATORY_CARE_PROVIDER_SITE_OTHER): Payer: Medicare Other | Admitting: Gynecology

## 2016-06-03 ENCOUNTER — Encounter: Payer: Self-pay | Admitting: Gynecology

## 2016-06-03 VITALS — BP 136/80

## 2016-06-03 DIAGNOSIS — N898 Other specified noninflammatory disorders of vagina: Secondary | ICD-10-CM

## 2016-06-03 DIAGNOSIS — R300 Dysuria: Secondary | ICD-10-CM

## 2016-06-03 DIAGNOSIS — R197 Diarrhea, unspecified: Secondary | ICD-10-CM | POA: Diagnosis not present

## 2016-06-03 DIAGNOSIS — M810 Age-related osteoporosis without current pathological fracture: Secondary | ICD-10-CM | POA: Insufficient documentation

## 2016-06-03 DIAGNOSIS — A599 Trichomoniasis, unspecified: Secondary | ICD-10-CM

## 2016-06-03 DIAGNOSIS — R198 Other specified symptoms and signs involving the digestive system and abdomen: Secondary | ICD-10-CM | POA: Diagnosis not present

## 2016-06-03 DIAGNOSIS — Z113 Encounter for screening for infections with a predominantly sexual mode of transmission: Secondary | ICD-10-CM

## 2016-06-03 DIAGNOSIS — R140 Abdominal distension (gaseous): Secondary | ICD-10-CM | POA: Diagnosis not present

## 2016-06-03 LAB — URINALYSIS W MICROSCOPIC + REFLEX CULTURE
Bilirubin Urine: NEGATIVE
CASTS: NONE SEEN [LPF]
Crystals: NONE SEEN [HPF]
Glucose, UA: NEGATIVE
Ketones, ur: NEGATIVE
Nitrite: NEGATIVE
Specific Gravity, Urine: >1.03 (ref 1.001–1.035)
WBC, UA: >60 WBC/HPF — AB (ref <=5)
YEAST: NONE SEEN [HPF]
pH: 6 (ref 5.0–8.0)

## 2016-06-03 LAB — WET PREP FOR TRICH, YEAST, CLUE: YEAST WET PREP: NONE SEEN

## 2016-06-03 LAB — CALCIUM: Calcium: 9.6 mg/dL (ref 8.6–10.4)

## 2016-06-03 MED ORDER — METRONIDAZOLE 500 MG PO TABS: 500.0000 mg | ORAL_TABLET | Freq: Two times a day (BID) | ORAL | 0 refills | Status: DC

## 2016-06-03 NOTE — Progress Notes (Signed)
Patient is a 56 year old that presented to the office today to discuss her recent bone density study. Had several complaints as follows:  Yellow-green vaginal discharge A few weeks ago she had mucus discharge from her rectum and no blood Abdominal bloating Diarrhea  Dysria Patient states she had some nausea but no vomiting, fever, chills or any back pain.  Exam: Back: No CVA tenderness Abdomen soft nontender no rebound or guarding pelvic: Bartholin urethra Skene was within normal limits Vagina yellowish green discharge vaginal cuff intact patient with prior history of LAVH Rectum no lesions seen abnormality noted  Wet prep moderate Trichomonas, moderate clue cells, too numerous to count white blood cell, too numerous to count bacteria  Urinalysis many bacteria, 3-10 RBC, greater than 60 WBC urine culture pending  Patient's bone density was reviewed and the lowest T score was -3.1 at the [facility] 2 years prior it was -2.8 patient states that she is not able to tolerate oral bisphosphonate she has gastroesophageal reflux disease. She states that she's been taking her calcium and recently she was found to have vitamin D deficiency for which she's currently taking vitamin D 50,000 units every weekly.  We had a discussion of alternative forms of treatment for her osteoporosis. She currently smokes 5 cigarettes per day we discussed the detrimental effects of smoking which could accelerate her bone loss. We discussed with annual IV infusion of Reclast as well as Prolioa every 6 months. The following was discussed:  The risk and benefits of Prolia were discussed with the patient today to include a significant risk reduction for vertebral, hip and non-vertebral fractures. Potential risk also discussed were skin advanced to include rash, pruritus, eczema and other skin reactions. The recurrence of infections and clinical studies with PROLIA, including serious infections were also discussed with the  patient. In 3 year clinical trial with Prolia although six-year trial data does not show any evidence for any additional or accelerating risk of infection. Other risks that were discussed included a 1 in 1000-10,000 risk of osteonecrosis of the jaw based on current available data  The risk and benefits of Forteo were discussed with the patient today to include a significant risk reduction for vertebral and nontender vertebral fractures. The most common side effects that have been reported include: Dizziness, and leg cramps although generally not severe enough to warrant discontinuation in the majority of patient on this medication. The black box warning was discussed in depth, which is based on the fact that supra-pharmacological doses of Forteo did increase the risk for benign and malignant bone tumors in rats, though there has not been an observed increase incidence in humans to date, based on over a decade of clinical experience. Patient does not have any relative contraindication to Forteo, including no history of previous therapeutic radiation therapy, active neoplasms involving the skeleton or Paget's disease.  The risk and benefits of IV bisphosphonate were discussed with the patient today to include a significant risk reduction for vertebral, hip and non-vertebral fractures. Potential risk of therapy were also discussed to include a 10-15% chance for a flulike reaction which may last 2-3 days after IV bisphosphonate. We also discussed that longer reactions lasting perhaps weeks to months have been rarely reported to the FDA following IV bisphosphonate treatment. This would also require symptomatic management. Other potential risk that were discussed included the following: Regular eye irritation, approximately 1 and 1000-10,000 risk for osteonecrosis of the jaw as well as a risk for atypical femoral fracture  of approximately 1 in 5000-10,000 based on current data on oral bisphosphonate. Signs and  symptoms of atypical femoral fracture were also discussed and the patient was asked to contact the office if she develops any of these symptoms.  She is interested in proceeding with the Prolia 60 mg subcutaneous every 6 months.  Assessment/plan: #1 osteoporosis we'll make arrangements for her to begin receiving Prolia 60 mg subcutaneous every 6 months. We'll check a calcium level today. #2 patient with vitamin D deficiency currently taking 50,000 units every weekly for 12 weeks. She was reminded to return to the office after completion of the 3 month check her vitamin D level and then begin taking vitamin D3 2000 units every daily. #3 patient with evidence of trichomoniasis will be started on Flagyl 500 mg twice a day for 7 days this may help with her diarrhea as well. GC and Chlamydia culture were done today as well as a complete STD screen consisting of HIV, RPR, hepatitis B and C #4 patient will be scheduled for an ultrasound the next one to 2 weeks as her abdominal bloating to assess her ovaries since she is overweight at that time we'll repeat the test of cure because of her trichomoniasis. She was reminded to contact her partner study can be treated as well. #5 urinalysis many bacteria may be contaminated we'll wait for the results of the urine culture and manage accordingly. #6 patient was reminded not to take any alcohol during the week that she's on the Flagyl.

## 2016-06-03 NOTE — Patient Instructions (Signed)
Metronidazole tablets or capsules What is this medicine? METRONIDAZOLE (me troe NI da zole) is an antiinfective. It is used to treat certain kinds of bacterial and protozoal infections. It will not work for colds, flu, or other viral infections. This medicine may be used for other purposes; ask your health care provider or pharmacist if you have questions. COMMON BRAND NAME(S): Flagyl What should I tell my health care provider before I take this medicine? They need to know if you have any of these conditions: -anemia or other blood disorders -disease of the nervous system -fungal or yeast infection -if you drink alcohol containing drinks -liver disease -seizures -an unusual or allergic reaction to metronidazole, or other medicines, foods, dyes, or preservatives -pregnant or trying to get pregnant -breast-feeding How should I use this medicine? Take this medicine by mouth with a full glass of water. Follow the directions on the prescription label. Take your medicine at regular intervals. Do not take your medicine more often than directed. Take all of your medicine as directed even if you think you are better. Do not skip doses or stop your medicine early. Talk to your pediatrician regarding the use of this medicine in children. Special care may be needed. Overdosage: If you think you have taken too much of this medicine contact a poison control center or emergency room at once. NOTE: This medicine is only for you. Do not share this medicine with others. What if I miss a dose? If you miss a dose, take it as soon as you can. If it is almost time for your next dose, take only that dose. Do not take double or extra doses. What may interact with this medicine? Do not take this medicine with any of the following medications: -alcohol or any product that contains alcohol -amprenavir oral solution -cisapride -disulfiram -dofetilide -dronedarone -paclitaxel injection -pimozide -ritonavir oral  solution -sertraline oral solution -sulfamethoxazole-trimethoprim injection -thioridazine -ziprasidone This medicine may also interact with the following medications: -birth control pills -cimetidine -lithium -other medicines that prolong the QT interval (cause an abnormal heart rhythm) -phenobarbital -phenytoin -warfarin This list may not describe all possible interactions. Give your health care provider a list of all the medicines, herbs, non-prescription drugs, or dietary supplements you use. Also tell them if you smoke, drink alcohol, or use illegal drugs. Some items may interact with your medicine. What should I watch for while using this medicine? Tell your doctor or health care professional if your symptoms do not improve or if they get worse. You may get drowsy or dizzy. Do not drive, use machinery, or do anything that needs mental alertness until you know how this medicine affects you. Do not stand or sit up quickly, especially if you are an older patient. This reduces the risk of dizzy or fainting spells. Avoid alcoholic drinks while you are taking this medicine and for three days afterward. Alcohol may make you feel dizzy, sick, or flushed. If you are being treated for a sexually transmitted disease, avoid sexual contact until you have finished your treatment. Your sexual partner may also need treatment. What side effects may I notice from receiving this medicine? Side effects that you should report to your doctor or health care professional as soon as possible: -allergic reactions like skin rash or hives, swelling of the face, lips, or tongue -confusion, clumsiness -difficulty speaking -discolored or sore mouth -dizziness -fever, infection -numbness, tingling, pain or weakness in the hands or feet -trouble passing urine or change in the amount of  urine -redness, blistering, peeling or loosening of the skin, including inside the mouth -seizures -unusually weak or  tired -vaginal irritation, dryness, or discharge Side effects that usually do not require medical attention (report to your doctor or health care professional if they continue or are bothersome): -diarrhea -headache -irritability -metallic taste -nausea -stomach pain or cramps -trouble sleeping This list may not describe all possible side effects. Call your doctor for medical advice about side effects. You may report side effects to FDA at 1-800-FDA-1088. Where should I keep my medicine? Keep out of the reach of children. Store at room temperature below 25 degrees C (77 degrees F). Protect from light. Keep container tightly closed. Throw away any unused medicine after the expiration date. NOTE: This sheet is a summary. It may not cover all possible information. If you have questions about this medicine, talk to your doctor, pharmacist, or health care provider.  2018 Elsevier/Gold Standard (2012-08-25 14:08:39) Denosumab injection What is this medicine? DENOSUMAB (den oh sue mab) slows bone breakdown. Prolia is used to treat osteoporosis in women after menopause and in men. Delton See is used to treat a high calcium level due to cancer and to prevent bone fractures and other bone problems caused by multiple myeloma or cancer bone metastases. Delton See is also used to treat giant cell tumor of the bone. This medicine may be used for other purposes; ask your health care provider or pharmacist if you have questions. COMMON BRAND NAME(S): Prolia, XGEVA What should I tell my health care provider before I take this medicine? They need to know if you have any of these conditions: -dental disease -having surgery or tooth extraction -infection -kidney disease -low levels of calcium or Vitamin D in the blood -malnutrition -on hemodialysis -skin conditions or sensitivity -thyroid or parathyroid disease -an unusual reaction to denosumab, other medicines, foods, dyes, or preservatives -pregnant or trying  to get pregnant -breast-feeding How should I use this medicine? This medicine is for injection under the skin. It is given by a health care professional in a hospital or clinic setting. If you are getting Prolia, a special MedGuide will be given to you by the pharmacist with each prescription and refill. Be sure to read this information carefully each time. For Prolia, talk to your pediatrician regarding the use of this medicine in children. Special care may be needed. For Delton See, talk to your pediatrician regarding the use of this medicine in children. While this drug may be prescribed for children as young as 13 years for selected conditions, precautions do apply. Overdosage: If you think you have taken too much of this medicine contact a poison control center or emergency room at once. NOTE: This medicine is only for you. Do not share this medicine with others. What if I miss a dose? It is important not to miss your dose. Call your doctor or health care professional if you are unable to keep an appointment. What may interact with this medicine? Do not take this medicine with any of the following medications: -other medicines containing denosumab This medicine may also interact with the following medications: -medicines that lower your chance of fighting infection -steroid medicines like prednisone or cortisone This list may not describe all possible interactions. Give your health care provider a list of all the medicines, herbs, non-prescription drugs, or dietary supplements you use. Also tell them if you smoke, drink alcohol, or use illegal drugs. Some items may interact with your medicine. What should I watch for while using this  medicine? Visit your doctor or health care professional for regular checks on your progress. Your doctor or health care professional may order blood tests and other tests to see how you are doing. Call your doctor or health care professional for advice if you get a  fever, chills or sore throat, or other symptoms of a cold or flu. Do not treat yourself. This drug may decrease your body's ability to fight infection. Try to avoid being around people who are sick. You should make sure you get enough calcium and vitamin D while you are taking this medicine, unless your doctor tells you not to. Discuss the foods you eat and the vitamins you take with your health care professional. See your dentist regularly. Brush and floss your teeth as directed. Before you have any dental work done, tell your dentist you are receiving this medicine. Do not become pregnant while taking this medicine or for 5 months after stopping it. Talk with your doctor or health care professional about your birth control options while taking this medicine. Women should inform their doctor if they wish to become pregnant or think they might be pregnant. There is a potential for serious side effects to an unborn child. Talk to your health care professional or pharmacist for more information. What side effects may I notice from receiving this medicine? Side effects that you should report to your doctor or health care professional as soon as possible: -allergic reactions like skin rash, itching or hives, swelling of the face, lips, or tongue -bone pain -breathing problems -dizziness -jaw pain, especially after dental work -redness, blistering, peeling of the skin -signs and symptoms of infection like fever or chills; cough; sore throat; pain or trouble passing urine -signs of low calcium like fast heartbeat, muscle cramps or muscle pain; pain, tingling, numbness in the hands or feet; seizures -unusual bleeding or bruising -unusually weak or tired Side effects that usually do not require medical attention (report to your doctor or health care professional if they continue or are bothersome): -constipation -diarrhea -headache -joint pain -loss of appetite -muscle pain -runny  nose -tiredness -upset stomach This list may not describe all possible side effects. Call your doctor for medical advice about side effects. You may report side effects to FDA at 1-800-FDA-1088. Where should I keep my medicine? This medicine is only given in a clinic, doctor's office, or other health care setting and will not be stored at home. NOTE: This sheet is a summary. It may not cover all possible information. If you have questions about this medicine, talk to your doctor, pharmacist, or health care provider.  2018 Elsevier/Gold Standard (2016-02-10 19:17:21) Osteoporosis Osteoporosis is the thinning and loss of density in the bones. Osteoporosis makes the bones more brittle, fragile, and likely to break (fracture). Over time, osteoporosis can cause the bones to become so weak that they fracture after a simple fall. The bones most likely to fracture are the bones in the hip, wrist, and spine. What are the causes? The exact cause is not known. What increases the risk? Anyone can develop osteoporosis. You may be at greater risk if you have a family history of the condition or have poor nutrition. You may also have a higher risk if you are:  Female.  86 years old or older.  A smoker.  Not physically active.  White or Asian.  Slender. What are the signs or symptoms? A fracture might be the first sign of the disease, especially if it results from  a fall or injury that would not usually cause a bone to break. Other signs and symptoms include:  Low back and neck pain.  Stooped posture.  Height loss. How is this diagnosed? To make a diagnosis, your health care provider may:  Take a medical history.  Perform a physical exam.  Order tests, such as:  A bone mineral density test.  A dual-energy X-ray absorptiometry test. How is this treated? The goal of osteoporosis treatment is to strengthen your bones to reduce your risk of a fracture. Treatment may involve:  Making  lifestyle changes, such as:  Eating a diet rich in calcium.  Doing weight-bearing and muscle-strengthening exercises.  Stopping tobacco use.  Limiting alcohol intake.  Taking medicine to slow the process of bone loss or to increase bone density.  Monitoring your levels of calcium and vitamin D. Follow these instructions at home:  Include calcium and vitamin D in your diet. Calcium is important for bone health, and vitamin D helps the body absorb calcium.  Perform weight-bearing and muscle-strengthening exercises as directed by your health care provider.  Do not use any tobacco products, including cigarettes, chewing tobacco, and electronic cigarettes. If you need help quitting, ask your health care provider.  Limit your alcohol intake.  Take medicines only as directed by your health care provider.  Keep all follow-up visits as directed by your health care provider. This is important.  Take precautions at home to lower your risk of falling, such as:  Keeping rooms well lit and clutter free.  Installing safety rails on stairs.  Using rubber mats in the bathroom and other areas that are often wet or slippery. Get help right away if: You fall or injure yourself. This information is not intended to replace advice given to you by your health care provider. Make sure you discuss any questions you have with your health care provider. Document Released: 10/28/2004 Document Revised: 06/23/2015 Document Reviewed: 06/28/2013 Elsevier Interactive Patient Education  2017 [employer]. Trichomoniasis Trichomoniasis is an STI (sexually transmitted infection) that can affect both women and men. In women, the outer area of the female genitalia (vulva) and the vagina are affected. In men, the penis is mainly affected, but the prostate and other reproductive organs can also be involved. This condition can be treated with medicine. It often has no symptoms (is asymptomatic), especially in  men. What are the causes? This condition is caused by an organism called Trichomonas vaginalis. Trichomoniasis most often spreads from person to person (is contagious) through sexual contact. What increases the risk? The following factors may make you more likely to develop this condition:  Having unprotected sexual intercourse.  Having sexual intercourse with a partner who has trichomoniasis.  Having multiple sexual partners.  Having had previous trichomoniasis infections or other STIs. What are the signs or symptoms? In women, symptoms of trichomoniasis include:  Abnormal vaginal discharge that is clear, white, gray, or yellow-green and foamy and has an unusual "fishy" odor.  Itching and irritation of the vagina and vulva.  Burning or pain during urination or sexual intercourse.  Genital redness and swelling. In men, symptoms of trichomoniasis include:  Penile discharge that may be foamy or contain pus.  Pain in the penis. This may happen only when urinating.  Itching or irritation inside the penis.  Burning after urination or ejaculation. How is this diagnosed? In women, this condition may be found during a routine Pap test or physical exam. It may be found in men during a  routine physical exam. Your health care provider may perform tests to help diagnose this infection, such as:  Urine tests (men and women).  The following in women:  Testing the pH of the vagina.  A vaginal swab test that checks for the Trichomonas vaginalis organism.  Testing vaginal secretions. Your health care provider may test you for other STIs, including HIV (human immunodeficiency virus). How is this treated? This condition is treated with medicine taken by mouth (orally), such as metronidazole or tinidazole to fight the infection. Your sexual partner(s) may also need to be tested and treated.  If you are a woman and you plan to become pregnant or think you may be pregnant, tell your health  care provider right away. Some medicines that are used to treat the infection should not be taken during pregnancy. Your health care provider may recommend over-the-counter medicines or creams to help relieve itching or irritation. You may be tested for infection again 3 months after treatment. Follow these instructions at home:  Take and use over-the-counter and prescription medicines, including creams, only as told by your health care provider.  Do not have sexual intercourse until one week after you finish your medicine, or until your health care provider approves. Ask your health care provider when you may resume sexual intercourse.  (Women) Do not douche or wear tampons while you have the infection.  Discuss your infection with your sexual partner(s). Make sure that your partner gets tested and treated, if necessary.  Keep all follow-up visits as told by your health care provider. This is important. How is this prevented?  Use condoms every time you have sex. Using condoms correctly and consistently can help protect against STIs.  Avoid having multiple sexual partners.  Talk with your sexual partner about any symptoms that either of you may have, as well as any history of STIs.  Get tested for STIs and STDs (sexually transmitted diseases) before you have sex. Ask your partner to do the same.  Do not have sexual contact if you have symptoms of trichomoniasis or another STI. Contact a health care provider if:  You still have symptoms after you finish your medicine.  You develop pain in your abdomen.  You have pain when you urinate.  You have bleeding after sexual intercourse.  You develop a rash.  You feel nauseous or you vomit.  You plan to become pregnant or think you may be pregnant. Summary  Trichomoniasis is an STI (sexually transmitted infection) that can affect both women and men.  This condition often has no symptoms (is asymptomatic), especially in men.  You  should not have sexual intercourse until one week after you finish your medicine, or until your health care provider approves. Ask your health care provider when you may resume sexual intercourse.  Discuss your infection with your sexual partner. Make sure that your partner gets tested and treated, if necessary. This information is not intended to replace advice given to you by your health care provider. Make sure you discuss any questions you have with your health care provider. Document Released: 07/14/2000 Document Revised: 12/12/2015 Document Reviewed: 12/12/2015 Elsevier Interactive Patient Education  2017 [employer].

## 2016-06-04 LAB — GC/CHLAMYDIA PROBE AMP
CT PROBE, AMP APTIMA: NOT DETECTED
GC PROBE AMP APTIMA: NOT DETECTED

## 2016-06-04 LAB — RPR

## 2016-06-04 LAB — HIV ANTIBODY (ROUTINE TESTING W REFLEX): HIV: NONREACTIVE

## 2016-06-04 LAB — HEPATITIS B SURFACE ANTIGEN: Hepatitis B Surface Ag: NEGATIVE

## 2016-06-04 LAB — HEPATITIS C ANTIBODY: HCV Ab: NEGATIVE

## 2016-06-05 LAB — URINE CULTURE

## 2016-06-16 ENCOUNTER — Other Ambulatory Visit: Payer: Self-pay | Admitting: Gynecology

## 2016-06-16 ENCOUNTER — Encounter: Payer: Self-pay | Admitting: Gynecology

## 2016-06-16 ENCOUNTER — Telehealth: Payer: Self-pay | Admitting: Gynecology

## 2016-06-16 ENCOUNTER — Ambulatory Visit (INDEPENDENT_AMBULATORY_CARE_PROVIDER_SITE_OTHER): Payer: Medicare Other

## 2016-06-16 ENCOUNTER — Ambulatory Visit (INDEPENDENT_AMBULATORY_CARE_PROVIDER_SITE_OTHER): Payer: Medicare Other | Admitting: Gynecology

## 2016-06-16 DIAGNOSIS — N83201 Unspecified ovarian cyst, right side: Secondary | ICD-10-CM | POA: Insufficient documentation

## 2016-06-16 DIAGNOSIS — R102 Pelvic and perineal pain unspecified side: Secondary | ICD-10-CM

## 2016-06-16 DIAGNOSIS — R140 Abdominal distension (gaseous): Secondary | ICD-10-CM | POA: Diagnosis not present

## 2016-06-16 MED ORDER — METRONIDAZOLE 500 MG PO TABS: 500.0000 mg | ORAL_TABLET | Freq: Two times a day (BID) | ORAL | 0 refills | Status: DC

## 2016-06-16 MED ORDER — FLUCONAZOLE 150 MG PO TABS: 150.0000 mg | ORAL_TABLET | Freq: Once | ORAL | 0 refills | Status: AC

## 2016-06-16 NOTE — Progress Notes (Addendum)
   Patient is a 56 year old was seen the office on May 3 with complaint of a yellow-green discharge and was found to have trichomoniasis for which she was treated with Flagyl 500 mg twice a day for 7 days. Patient's having some slight pruritus she is not sexually active. She had a full STD screening consisting a GC and Chlamydia culture HIV, RPR hepatitis B and C last office visit which was normal. She's currently undergoing treatment for vitamin D deficiency with 50,000 units every weekly for 12 weeks which she will complete in a few months and will return back to check her vitamin D level. Patient also with osteoporosis in the process of being scheduled to receive Prolia 60 mg IM every 6 months. See previous note detailing her past bone density study and her side effects from oral bisphosphonate.  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  Assessment/plan: Patient with nonspecific low abdominal discomfort I've recommended she follow-up with her gastroenterologist since his been 9 years since her colonoscopy and she has suffer from on and off from diarrhea. Patient not sexually active. STD screening negative. Physiological small cyst noted no suspicion. Patient otherwise scheduled to return back in one year for annual exam or when necessary. We will extend her Flagyl 500 mg twice a day for 5 more days and: Diflucan 150 mg one by mouth

## 2016-06-16 NOTE — Telephone Encounter (Signed)
Check insurance benefits osteoporosis.

## 2016-06-16 NOTE — Addendum Note (Signed)
Addended by: Ok EdwardsFERNANDEZ, Haillee Johann H on: 06/16/2016 03:27 PM   Modules accepted: Orders

## 2016-06-16 NOTE — Patient Instructions (Signed)
Ovarian Cyst  An ovarian cyst is a fluid-filled sac that forms on an ovary. The ovaries are small organs that produce eggs in women. Various types of cysts can form on the ovaries. Some may cause symptoms and require treatment. Most ovarian cysts go away on their own, are not cancerous (are benign), and do not cause problems. Common types of ovarian cysts include:  Functional (follicle) cysts.  Occur during the menstrual cycle, and usually go away with the next menstrual cycle if you do not get pregnant.  Usually cause no symptoms.  Endometriomas.  Are cysts that form from the tissue that lines the uterus (endometrium).  Are sometimes called "chocolate cysts" because they become filled with blood that turns brown.  Can cause pain in the lower abdomen during intercourse and during your period.  Cystadenoma cysts.  Develop from cells on the outside surface of the ovary.  Can get very large and cause lower abdomen pain and pain with intercourse.  Can cause severe pain if they twist or break open (rupture).  Dermoid cysts.  Are sometimes found in both ovaries.  May contain different kinds of body tissue, such as skin, teeth, hair, or cartilage.  Usually do not cause symptoms unless they get very big.  Theca lutein cysts.  Occur when too much of a certain hormone (human chorionic gonadotropin) is produced and overstimulates the ovaries to produce an egg.  Are most common after having procedures used to assist with the conception of a baby (in vitro fertilization). What are the causes? Ovarian cysts may be caused by:  Ovarian hyperstimulation syndrome. This is a condition that can develop from taking fertility medicines. It causes multiple large ovarian cysts to form.  Polycystic ovarian syndrome (PCOS). This is a common hormonal disorder that can cause ovarian cysts, as well as problems with your period or fertility. What increases the risk? The following factors may make you  more likely to develop ovarian cysts:  Being overweight or obese.  Taking fertility medicines.  Taking certain forms of hormonal birth control.  Smoking. What are the signs or symptoms? Many ovarian cysts do not cause symptoms. If symptoms are present, they may include:  Pelvic pain or pressure.  Pain in the lower abdomen.  Pain during sex.  Abdominal swelling.  Abnormal menstrual periods.  Increasing pain with menstrual periods. How is this diagnosed? These cysts are commonly found during a routine pelvic exam. You may have tests to find out more about the cyst, such as:  Ultrasound.  X-ray of the pelvis.  CT scan.  MRI.  Blood tests. How is this treated? Many ovarian cysts go away on their own without treatment. Your health care provider may want to check your cyst regularly for 2-3 months to see if it changes. If you are in menopause, it is especially important to have your cyst monitored closely because menopausal women have a higher rate of ovarian cancer. When treatment is needed, it may include:  Medicines to help relieve pain.  A procedure to drain the cyst (aspiration).  Surgery to remove the whole cyst.  Hormone treatment or birth control pills. These methods are sometimes used to help dissolve a cyst. Follow these instructions at home:  Take over-the-counter and prescription medicines only as told by your health care provider.  Do not drive or use heavy machinery while taking prescription pain medicine.  Get regular pelvic exams and Pap tests as often as told by your health care provider.  Return to your   normal activities as told by your health care provider. Ask your health care provider what activities are safe for you.  Do not use any products that contain nicotine or tobacco, such as cigarettes and e-cigarettes. If you need help quitting, ask your health care provider.  Keep all follow-up visits as told by your health care provider. This is  important. Contact a health care provider if:  Your periods are late, irregular, or painful, or they stop.  You have pelvic pain that does not go away.  You have pressure on your bladder or trouble emptying your bladder completely.  You have pain during sex.  You have any of the following in your abdomen:  A feeling of fullness.  Pressure.  Discomfort.  Pain that does not go away.  Swelling.  You feel generally ill.  You become constipated.  You lose your appetite.  You develop severe acne.  You start to have more body hair and facial hair.  You are gaining weight or losing weight without changing your exercise and eating habits.  You think you may be pregnant. Get help right away if:  You have abdominal pain that is severe or gets worse.  You cannot eat or drink without vomiting.  You suddenly develop a fever.  Your menstrual period is much heavier than usual. This information is not intended to replace advice given to you by your health care provider. Make sure you discuss any questions you have with your health care provider. Document Released: 01/18/2005 Document Revised: 08/08/2015 Document Reviewed: 06/22/2015 Elsevier Interactive Patient Education  2017 [employer]  

## 2016-06-29 NOTE — Telephone Encounter (Signed)
PC to pt check  Insurance benefits Calcium 9.6  06/03/16  Complete exam. 01/20/16  M81.0

## 2016-06-30 ENCOUNTER — Telehealth: Payer: Self-pay

## 2016-06-30 NOTE — Telephone Encounter (Signed)
Received 4 pages from [facility], forwarded records to [facility].

## 2016-07-12 ENCOUNTER — Encounter: Payer: Self-pay | Admitting: Anesthesiology

## 2016-07-12 NOTE — Telephone Encounter (Signed)
pc to pt left Vm to return my call [phone] Insurance benefits Deductible met. No co pay no co insurance With or without OV. Prolia coverage 100% $0 cost for patient.

## 2016-07-26 NOTE — Telephone Encounter (Signed)
Third VM to pt. No return PC from her. Asked her to call me  with decision regarding Prolia.

## 2016-08-23 NOTE — Telephone Encounter (Signed)
PC to pt left VM about prolia asked her to call with decision.

## 2016-08-26 NOTE — Telephone Encounter (Signed)
Numerous phone calls to pt with no return call regarding Prolia injections. Close encounter due to no response from patient.

## 2017-06-25 ENCOUNTER — Encounter (HOSPITAL_COMMUNITY): Payer: Self-pay | Admitting: Emergency Medicine

## 2017-06-25 ENCOUNTER — Emergency Department (HOSPITAL_COMMUNITY)
Admission: EM | Admit: 2017-06-25 | Discharge: 2017-06-25 | Discharge status: Against Medical Advice | Payer: Medicare Other | Attending: Emergency Medicine | Admitting: Emergency Medicine

## 2017-06-25 DIAGNOSIS — G8929 Other chronic pain: Secondary | ICD-10-CM

## 2017-06-25 DIAGNOSIS — M545 Low back pain: Secondary | ICD-10-CM | POA: Diagnosis present

## 2017-06-25 DIAGNOSIS — F17210 Nicotine dependence, cigarettes, uncomplicated: Secondary | ICD-10-CM | POA: Insufficient documentation

## 2017-06-25 DIAGNOSIS — E119 Type 2 diabetes mellitus without complications: Secondary | ICD-10-CM | POA: Diagnosis not present

## 2017-06-25 DIAGNOSIS — Z79899 Other long term (current) drug therapy: Secondary | ICD-10-CM | POA: Diagnosis not present

## 2017-06-25 DIAGNOSIS — E039 Hypothyroidism, unspecified: Secondary | ICD-10-CM | POA: Insufficient documentation

## 2017-06-25 NOTE — ED Triage Notes (Signed)
Patient complains of chronic back pain. Patient was taking  of oxycodone IR Q8H but ran out a few days ago. Patient has appointment May 30 to see her doctor but states the pain has gotten too great to wait for her appointment.

## 2017-06-25 NOTE — ED Notes (Signed)
Pt to desk stating she wanted to leave AMA.

## 2017-06-25 NOTE — ED Provider Notes (Signed)
[facility] EMERGENCY DEPARTMENT Provider Note   CSN: [id number] Arrival date & time: 06/25/17  7829     History   Chief Complaint Chief Complaint  Patient presents with  . Back Pain    HPI Kari Gonzales is a 57 y.o. female with a history of chronic back pain, coccygeal fracture, bipolar disorder, diabetes mellitus, and panic attacks who presents to the emergency department with a chief complaint of back pain.  The patient endorses chronic bilateral low back pain.  No new trauma or injury.  She states that the pain is worse because she ran out of her 15 mg q6h oxycodone in the last few days.  She reports that she is continued to be compliant with her home Adderall, Xanax, Ambien, and Soma.    She states that she has a follow-up appointment with Dr. Retia Passe on May 30, but will not be able to get her home oxycodone filled until then as she is no longer established with her previous primary care doctor. She denies numbness, weakness, fever, chills, urinary or fecal incontinence, dysuria, hematuria, vaginal pain or discharge.  The history is provided by the patient. No language interpreter was used.  Back Pain   Pertinent negatives include no chest pain, no fever, no headaches, no abdominal pain and no dysuria.    Past Medical History:  Diagnosis Date  . Bipolar disorder (HCC)   . Chronic back pain   . Coccygeal fracture (HCC)   . Degenerative disk disease   . Depression   . Diabetes mellitus   . Diabetic neuropathy (HCC)   . Hypothyroid   . Lumbar herniated disc    x2  . Osteoporosis   . Panic attacks   . Sciatica   . Sciatica of left side 05/07/2013  . Vitamin D deficiency     Patient Active Problem List   Diagnosis Date Noted  . Ovarian cyst, right 06/16/2016  . Age-related osteoporosis without current pathological fracture 06/03/2016  . Sciatica of left side 05/07/2013  . DIABETES MELLITUS, TYPE I 04/08/2010  . BACK PAIN, LUMBAR, CHRONIC 04/08/2010    . SACROILIAC JOINT DYSFUNCTION 04/08/2010  . TROCHANTERIC BURSITIS, LEFT 04/08/2010  . OSTEOPOROSIS 04/08/2010  . UNEQUAL LEG LENGTH 04/08/2010  . HYPOTHYROIDISM 07/01/2009  . WEAKNESS 07/01/2009  . PALPITATIONS 07/01/2009    Past Surgical History:  Procedure Laterality Date  . ABDOMINAL HYSTERECTOMY     partial  . FOOT SURGERY Left   . TONSILLECTOMY       OB History    Gravida  4   Para  1   Term      Preterm      AB  3   Living  1     SAB      TAB      Ectopic  0   Multiple      Live Births               Home Medications    Prior to Admission medications   Medication Sig Start Date End Date Taking? Authorizing Provider  albuterol (PROVENTIL) (2.5 MG/3ML) 0.083% nebulizer solution Take by nebulization daily as needed.  04/23/14   [provider]  ALPRAZolam Prudy Feeler) 1 MG tablet Take 1 mg by mouth 4 (four) times daily as needed. anxiety    [provider]  amphetamine-dextroamphetamine (ADDERALL) 30 MG tablet Take 1 tablet by mouth 2 (two) times daily. 04/07/13   [provider]  carisoprodol (SOMA) 350 MG  tablet Take 350 mg by mouth 4 (four) times daily as needed for muscle spasms.    [provider]  fluticasone (FLONASE) 50 MCG/ACT nasal spray Place 2 sprays into the nose daily as needed.     [provider]  levothyroxine (SYNTHROID, LEVOTHROID) 88 MCG tablet Take 88 mcg by mouth daily.    [provider]  metroNIDAZOLE (FLAGYL) 500 MG tablet Take 1 tablet (500 mg total) by mouth 2 (two) times daily. 06/16/16   Ok Edwards, MD  Oxycodone HCl 10 MG TABS Take 10 mg by mouth 4 (four) times daily as needed.    [provider]  Vitamin D, Ergocalciferol, (DRISDOL) 50000 units CAPS capsule Take 1 capsule (50,000 Units total) by mouth every 7 (seven) days. 04/20/16   Ok Edwards, MD  VRAYLAR 1.5 MG CAPS Take 1 tablet by mouth daily. 01/09/16   [provider]  zolpidem (AMBIEN CR)  12.5 MG CR tablet as needed. 01/08/16   [provider]    Family History Family History  Problem Relation Age of Onset  . Diabetes Mother   . Osteoarthritis Mother   . Depression Mother   . Raynaud syndrome Mother   . Hypertension Mother   . Cancer Mother        uterine  . COPD Mother   . Depression Father   . Hypertension Father   . Hypotension Father   . Cancer Maternal Grandmother        uterine  . Breast cancer Paternal Grandmother     Social History Social History   Tobacco Use  . Smoking status: Current Every Day Smoker    Packs/day: 1.00  . Smokeless tobacco: Never Used  Substance Use Topics  . Alcohol use: Yes    Comment: occasional  . Drug use: Yes    Types: "Crack" cocaine     Allergies   Sulfonamide derivatives; Morphine; Prednisone; Aspirin; and Ibuprofen   Review of Systems Review of Systems  Constitutional: Negative for activity change, chills and fever.  Respiratory: Negative for shortness of breath.   Cardiovascular: Negative for chest pain.  Gastrointestinal: Negative for abdominal pain.  Genitourinary: Negative for dysuria, vaginal discharge and vaginal pain.  Musculoskeletal: Positive for arthralgias, back pain and myalgias. Negative for gait problem.  Skin: Negative for rash.  Allergic/Immunologic: Negative for immunocompromised state.  Neurological: Negative for headaches.  Psychiatric/Behavioral: Negative for confusion.     Physical Exam Updated Vital Signs BP (!) 133/102 (BP Location: Right Arm)   Pulse 99   Temp 97.7 F (36.5 C) (Oral)   Resp 18   SpO2 100%   Physical Exam  Constitutional: No distress.  She appears anxious.  Poor eye contact.  HENT:  Head: Normocephalic.  Eyes: Conjunctivae are normal.  Neck: Neck supple.  Cardiovascular: Normal rate and regular rhythm. Exam reveals no gallop and no friction rub.  No murmur heard. Pulmonary/Chest: Effort normal. No respiratory distress.  Abdominal: Soft. She  exhibits no distension.  Musculoskeletal: She exhibits tenderness. She exhibits no edema or deformity.  Mildly tender to palpation to the spinous processes of the lumbar spine.  No tenderness to the cervical or thoracic spinous processes.  She is also diffusely tender bilaterally to the paraspinal muscles of the lumbar spine.  Paraspinal muscles of the cervical and thoracic spine are nontender.  5 out of 5 strength against resistance of the bilateral lower extremities.  DP PT pulses are 2+ and symmetric.  Sensation is intact throughout.  Symmetric tandem gait.  Neurological: She is alert.  Skin: Skin is warm. No rash noted.  Psychiatric: Her behavior is normal.  Nursing note and vitals reviewed.    ED Treatments / Results  Labs (all labs ordered are listed, but only abnormal results are displayed) Labs Reviewed - No data to display  EKG None  Radiology No results found.  Procedures Procedures (including critical care time)  Medications Ordered in ED Medications - No data to display   Initial Impression / Assessment and Plan / ED Course  I have reviewed the triage vital signs and the nursing notes.  Pertinent labs & imaging results that were available during my care of the patient were reviewed by me and considered in my medical decision making (see chart for details).     57 year old female with a history of chronic back pain, coccygeal fracture, bipolar disorder, diabetes mellitus, and panic attacks who presents to the emergency department with a chief complaint of back pain.  No acute trauma or injury.  She has no associated symptoms.  Doubt cauda equina, pyelonephritis, nephrolithiasis, fracture to the lumbar spine, or intra-abdominal infection.  The patient's medical record was reviewed, including previous medical charts and imaging.  MR pelvis in 2015 was normal.  MRI lumbar spine in 2014 with mild L3-L4 disc degeneration and mild chronic L1-L2 and L2-L3 disc degeneration  without spinal stenosis or convincing neural impingement.  She is out of her home 15 mg oxycodone that she takes every 6 hours.  [place] controlled substance database was checked.  Oxycodone was last filled on May 25, 2017.  The patient's original chart so that she was a Medicare patient.  I went to find my attending physician, Dr. Eudelia Bunch, to evaluate the patient prior to discussing a plan prior to discharge.  When I returned, the patient had told nursing staff that she was leaving and had left the Emergency Department.   Final Clinical Impressions(s) / ED Diagnoses   Final diagnoses:  Chronic bilateral low back pain without sciatica    ED Discharge Orders    None       Barkley Boards, PA-C 06/25/17 1505    Nira Conn, MD 06/25/17 [phone]

## 2017-07-05 ENCOUNTER — Other Ambulatory Visit: Payer: Self-pay | Admitting: Rehabilitation

## 2017-07-05 DIAGNOSIS — G894 Chronic pain syndrome: Secondary | ICD-10-CM

## 2018-02-27 ENCOUNTER — Other Ambulatory Visit: Payer: Self-pay | Admitting: Internal Medicine

## 2018-02-27 DIAGNOSIS — Z1231 Encounter for screening mammogram for malignant neoplasm of breast: Secondary | ICD-10-CM

## 2018-03-27 ENCOUNTER — Ambulatory Visit: Payer: Medicare Other

## 2018-06-22 ENCOUNTER — Other Ambulatory Visit (HOSPITAL_BASED_OUTPATIENT_CLINIC_OR_DEPARTMENT_OTHER): Payer: Self-pay

## 2018-06-22 DIAGNOSIS — G4709 Other insomnia: Secondary | ICD-10-CM

## 2018-07-25 ENCOUNTER — Other Ambulatory Visit (HOSPITAL_COMMUNITY)
Admission: RE | Admit: 2018-07-25 | Discharge: 2018-07-25 | Disposition: A | Discharge status: Home / Self Care | Payer: Medicare Other | Source: Ambulatory Visit | Attending: Internal Medicine | Admitting: Internal Medicine

## 2018-07-25 DIAGNOSIS — Z1159 Encounter for screening for other viral diseases: Secondary | ICD-10-CM | POA: Insufficient documentation

## 2018-07-25 LAB — SARS CORONAVIRUS 2 (TAT 6-24 HRS): SARS Coronavirus 2: NEGATIVE

## 2018-07-28 ENCOUNTER — Ambulatory Visit (HOSPITAL_BASED_OUTPATIENT_CLINIC_OR_DEPARTMENT_OTHER): Payer: Medicare Other | Attending: Physical Medicine and Rehabilitation | Admitting: Internal Medicine

## 2018-07-28 VITALS — Ht 65.0 in | Wt 165.0 lb

## 2018-10-25 ENCOUNTER — Encounter: Payer: Self-pay | Admitting: Gynecology

## 2018-12-07 ENCOUNTER — Encounter (HOSPITAL_COMMUNITY): Payer: Self-pay

## 2018-12-07 ENCOUNTER — Other Ambulatory Visit: Payer: Self-pay

## 2018-12-07 ENCOUNTER — Emergency Department (HOSPITAL_COMMUNITY): Payer: Medicare Other

## 2018-12-07 ENCOUNTER — Emergency Department (HOSPITAL_COMMUNITY)
Admission: EM | Admit: 2018-12-07 | Discharge: 2018-12-07 | Disposition: A | Discharge status: Home / Self Care | Payer: Medicare Other | Attending: Emergency Medicine | Admitting: Emergency Medicine

## 2018-12-07 DIAGNOSIS — Z79899 Other long term (current) drug therapy: Secondary | ICD-10-CM | POA: Insufficient documentation

## 2018-12-07 DIAGNOSIS — Y939 Activity, unspecified: Secondary | ICD-10-CM | POA: Insufficient documentation

## 2018-12-07 DIAGNOSIS — E119 Type 2 diabetes mellitus without complications: Secondary | ICD-10-CM | POA: Insufficient documentation

## 2018-12-07 DIAGNOSIS — E039 Hypothyroidism, unspecified: Secondary | ICD-10-CM | POA: Diagnosis not present

## 2018-12-07 DIAGNOSIS — Y929 Unspecified place or not applicable: Secondary | ICD-10-CM | POA: Diagnosis not present

## 2018-12-07 DIAGNOSIS — W010XXA Fall on same level from slipping, tripping and stumbling without subsequent striking against object, initial encounter: Secondary | ICD-10-CM

## 2018-12-07 DIAGNOSIS — F17210 Nicotine dependence, cigarettes, uncomplicated: Secondary | ICD-10-CM | POA: Insufficient documentation

## 2018-12-07 DIAGNOSIS — F319 Bipolar disorder, unspecified: Secondary | ICD-10-CM | POA: Insufficient documentation

## 2018-12-07 DIAGNOSIS — S7002XA Contusion of left hip, initial encounter: Secondary | ICD-10-CM | POA: Diagnosis not present

## 2018-12-07 DIAGNOSIS — Y999 Unspecified external cause status: Secondary | ICD-10-CM | POA: Diagnosis not present

## 2018-12-07 DIAGNOSIS — Y9389 Activity, other specified: Secondary | ICD-10-CM | POA: Diagnosis not present

## 2018-12-07 DIAGNOSIS — W1830XA Fall on same level, unspecified, initial encounter: Secondary | ICD-10-CM | POA: Diagnosis not present

## 2018-12-07 DIAGNOSIS — S79912A Unspecified injury of left hip, initial encounter: Secondary | ICD-10-CM | POA: Diagnosis present

## 2018-12-07 MED ORDER — HYDROMORPHONE HCL 1 MG/ML IJ SOLN: 0.5000 mg | Freq: Once | INTRAMUSCULAR | Status: DC

## 2018-12-07 MED ORDER — HYDROMORPHONE HCL 1 MG/ML IJ SOLN
0.5000 mg | Freq: Once | INTRAMUSCULAR | Status: AC
  Administered 2018-12-07: 0.5 mg via INTRAVENOUS
  Filled 2018-12-07: qty 1

## 2018-12-07 NOTE — Discharge Instructions (Addendum)
It was our pleasure to provide your ER care today - we hope that you feel better.  Your xrays are negative for fracture.   Take your pain medication/muscle relaxer as need.  Follow up with primary care doctor in 1 week if symptoms fail to improve/resolve.  Return to ER if worse, new symptoms, new/worsening or intractable pain, numbness or weakness, or other concern.   You were given pain medication in the ER - no driving for the next 6 hours.

## 2018-12-07 NOTE — ED Notes (Signed)
Patient verbalizes understanding of discharge instructions. Opportunity for questioning and answers were provided. Armband removed by staff, pt discharged from ED ambulatory by self. ,

## 2018-12-07 NOTE — ED Provider Notes (Signed)
[facility] EMERGENCY DEPARTMENT Provider Note   CSN: [id number] Arrival date & time: 12/07/18  1618     History   Chief Complaint Chief Complaint  Patient presents with  . Fall    HPI Kari Gonzales is a 58 y.o. female.     Patient presents s/p mechanical fall via EMS. Patient states was at bar, had 1.5 drinks, and tripped over her bootstraps falling onto left hip. C/o acute onset left hip pain, dull, moderate-severe, constant, worse w movement. No radicular pain. No leg numbness/weakness. Denies saddle area or perineal numbness. No head injury or loc. No faintness or dizziness prior to fall. Hx chronic, intermittent low back pain. Denies neck pain. Skin intact. Denies other pain or injury. States prior to trip and fall felt normal, at baseline.   The history is provided by the patient and the EMS personnel.    Past Medical History:  Diagnosis Date  . Bipolar disorder (HCC)   . Chronic back pain   . Coccygeal fracture (HCC)   . Degenerative disk disease   . Depression   . Diabetes mellitus   . Diabetic neuropathy (HCC)   . Hypothyroid   . Lumbar herniated disc    x2  . Osteoporosis   . Panic attacks   . Sciatica   . Sciatica of left side 05/07/2013  . Vitamin D deficiency     Patient Active Problem List   Diagnosis Date Noted  . Ovarian cyst, right 06/16/2016  . Age-related osteoporosis without current pathological fracture 06/03/2016  . Sciatica of left side 05/07/2013  . DIABETES MELLITUS, TYPE I 04/08/2010  . BACK PAIN, LUMBAR, CHRONIC 04/08/2010  . SACROILIAC JOINT DYSFUNCTION 04/08/2010  . TROCHANTERIC BURSITIS, LEFT 04/08/2010  . OSTEOPOROSIS 04/08/2010  . UNEQUAL LEG LENGTH 04/08/2010  . HYPOTHYROIDISM 07/01/2009  . WEAKNESS 07/01/2009  . PALPITATIONS 07/01/2009    Past Surgical History:  Procedure Laterality Date  . ABDOMINAL HYSTERECTOMY     partial  . CLOSED REDUCTION PROXIMAL FIBULAR FRACTURE    . FOOT SURGERY Left   .  TONSILLECTOMY       OB History    Gravida  4   Para  1   Term      Preterm      AB  3   Living  1     SAB      TAB      Ectopic  0   Multiple      Live Births               Home Medications    Prior to Admission medications   Medication Sig Start Date End Date Taking? Authorizing Provider  albuterol (PROVENTIL) (2.5 MG/3ML) 0.083% nebulizer solution Take 2.5 mg by nebulization daily as needed for shortness of breath.  04/23/14  Yes [provider]  ALPRAZolam (XANAX) 1 MG tablet Take 1 mg by mouth 4 (four) times daily as needed. anxiety   Yes [provider]  amphetamine-dextroamphetamine (ADDERALL) 30 MG tablet Take 1 tablet by mouth 2 (two) times daily. 04/07/13  Yes [provider]  carisoprodol (SOMA) 350 MG tablet Take 350 mg by mouth 4 (four) times daily as needed for muscle spasms.   Yes [provider]  fluticasone (FLONASE) 50 MCG/ACT nasal spray Place 2 sprays into the nose daily as needed for allergies.    Yes [provider]  gabapentin (NEURONTIN) 600 MG tablet Take 600 mg by mouth at bedtime.  Yes [provider]  levothyroxine (SYNTHROID) 75 MCG tablet Take 75 mcg by mouth daily before breakfast.   Yes [provider]  metoprolol tartrate (LOPRESSOR) 25 MG tablet Take 25 mg by mouth 2 (two) times daily.   Yes [provider]  Oxycodone HCl 10 MG TABS Take 10 mg by mouth 4 (four) times daily as needed (For pain).    Yes [provider]  promethazine (PHENERGAN) 25 MG tablet Take 25 mg by mouth every 6 (six) hours as needed for nausea or vomiting.   Yes [provider]  Vitamin D, Ergocalciferol, (DRISDOL) 50000 units CAPS capsule Take 1 capsule (50,000 Units total) by mouth every 7 (seven) days. 04/20/16  Yes Ok Edwards, MD  zolpidem (AMBIEN CR) 12.5 MG CR tablet Take 12.5 mg by mouth at bedtime as needed for sleep.  01/08/16  Yes [provider]     Family History Family History  Problem Relation Age of Onset  . Diabetes Mother   . Osteoarthritis Mother   . Depression Mother   . Raynaud syndrome Mother   . Hypertension Mother   . Cancer Mother        uterine  . COPD Mother   . Depression Father   . Hypertension Father   . Hypotension Father   . Cancer Maternal Grandmother        uterine  . Breast cancer Paternal Grandmother     Social History Social History   Tobacco Use  . Smoking status: Current Every Day Smoker    Packs/day: 1.00  . Smokeless tobacco: Never Used  Substance Use Topics  . Alcohol use: Yes    Comment: occasional  . Drug use: Yes    Types: "Crack" cocaine     Allergies   Sulfonamide derivatives, Morphine, Prednisone, Aspirin, and Ibuprofen   Review of Systems Review of Systems  Constitutional: Negative for fever.  HENT: Negative for nosebleeds.   Eyes: Negative for visual disturbance.  Respiratory: Negative for shortness of breath.   Cardiovascular: Negative for chest pain.  Gastrointestinal: Negative for abdominal pain and vomiting.  Genitourinary: Negative for flank pain.  Musculoskeletal: Negative for neck pain.  Skin: Negative for wound.  Neurological: Negative for weakness, numbness and headaches.  Hematological: Does not bruise/bleed easily.  Psychiatric/Behavioral: Negative for confusion.     Physical Exam Updated Vital Signs BP 103/78   Pulse 100   Temp 98.3 F (36.8 C) (Oral)   Resp 18   Ht 1.651 m ( )   Wt 73 kg   SpO2 95%   BMI 26.79 kg/m   Physical Exam Vitals signs and nursing note reviewed.  Constitutional:      Appearance: Normal appearance. She is well-developed.  HENT:     Head: Atraumatic.     Comments: No facial, head or scalp pain, sts, or tenderness.     Nose: Nose normal.     Mouth/Throat:     Mouth: Mucous membranes are moist.  Eyes:     General: No scleral icterus.    Conjunctiva/sclera: Conjunctivae normal.     Pupils: Pupils are equal,  round, and reactive to light.  Neck:     Musculoskeletal: Normal range of motion and neck supple. No neck rigidity or muscular tenderness.     Trachea: No tracheal deviation.  Cardiovascular:     Rate and Rhythm: Normal rate and regular rhythm.     Pulses: Normal pulses.     Heart sounds: Normal heart sounds. No  murmur. No friction rub. No gallop.   Pulmonary:     Effort: Pulmonary effort is normal. No respiratory distress.     Breath sounds: Normal breath sounds.  Abdominal:     General: Bowel sounds are normal. There is no distension.     Palpations: Abdomen is soft.     Tenderness: There is no abdominal tenderness. There is no guarding.  Genitourinary:    Comments: No cva tenderness.  Musculoskeletal:        General: No swelling.     Comments: Lumbar tenderness, otherwise, CTLS spine, non tender, aligned, no step off. Tenderness left hip, otherwise good rom bil ext without pain or focal bony tenderness. Distal pulses palp bil. LLE motor and sens intact.   Skin:    General: Skin is warm and dry.     Findings: No rash.  Neurological:     Mental Status: She is alert.     Comments: Alert, speech normal. gcs 15. Motor intact bil, stre 5/5. sens grossly intact bil.   Psychiatric:        Mood and Affect: Mood normal.      ED Treatments / Results  Labs (all labs ordered are listed, but only abnormal results are displayed) Labs Reviewed - No data to display  EKG None  Radiology Xr Ls Spine  Result Date: 12/07/2018 CLINICAL DATA:  Fall, pain, left posterior hip pain and lower back pain EXAM: LUMBAR SPINE - COMPLETE 4+ VIEW COMPARISON:  Lumbar radiographs 01/27/2010, lumbar MRI 08/25/2012 FINDINGS: Five lumbar type vertebral bodies. No acute fracture, vertebral body height loss or traumatic listhesis. Coarse calcification inferior to the right L1 transverse process, possibly vascular, ingested material or urolithiasis. Mild degenerative changes noted in both SI joints. No gross  acute sacral abnormality. Bowel gas pattern is normal. Atherosclerotic calcification noted in the abdominal aorta. IMPRESSION: 1. No acute osseous abnormality. 2. Coarse calcification inferior to the right L1 transverse process, possibly vascular, ingested material or urolithiasis. 3.  Aortic Atherosclerosis (ICD10-I70.0). Electronically Signed   By: Lovena Le M.D.   On: 12/07/2018 18:13   Xr Hip Left  Result Date: 12/07/2018 CLINICAL DATA:  Fall, pain EXAM: DG HIP (WITH OR WITHOUT PELVIS) 2-3V LEFT COMPARISON:  None. FINDINGS: There is no evidence of hip fracture or dislocation. Mild degenerative changes in both the hips and SI joints. No suspicious osseous lesions. Bowel gas pattern is normal. Soft tissues are unremarkable. IMPRESSION: No acute osseous abnormality. Electronically Signed   By: Lovena Le M.D.   On: 12/07/2018 18:10    Procedures Procedures (including critical care time)  Medications Ordered in ED Medications  HYDROmorphone (DILAUDID) injection 0.5 mg (0.5 mg Intravenous Given 12/07/18 1644)     Initial Impression / Assessment and Plan / ED Course  I have reviewed the triage vital signs and the nursing notes.  Pertinent labs & imaging results that were available during my care of the patient were reviewed by me and considered in my medical decision making (see chart for details).  EMS gave fentanyl 50 mcg iv. Pt notes improvement but then states is wearing off. Dilaudid .5 mg iv.   Xrays ordered.   Reviewed nursing notes and prior charts for additional history.   xrays reviewed/interpreted by me - no fx. Discussed w pt.  Patient ambulatory about ED, steady gait, and pt requests d/c to home.   Pain controlled, pt continues to request d/c. Is tolerating po, ambulatory. No new c/o.  No headache. No neck pain.  No numbness/weakness.  Pt currently appears stable for d/c.   rec pcp f/u.  Return precautions provided.     Final Clinical Impressions(s) / ED  Diagnoses   Final diagnoses:  Fall from slip, trip, or stumble, initial encounter  Contusion of left hip, initial encounter    ED Discharge Orders    None       Cathren Laine, MD 12/08/18 1624

## 2018-12-07 NOTE — ED Triage Notes (Addendum)
Pt arrived via [facility], pt was at bar and tripped over her shoe laces and fell. Pt denies hitting head. Pt c/o 6/10 left hip pain. EMS gave fentanyl 41mcg. Pt has numbness and tingling in left foot. 2+ left pedal pulse, sensation diminished due to neuropathy, cap refill less than 3 sec. Pt is A&Ox4. VS WNL.

## 2018-12-07 NOTE — ED Notes (Signed)
Patient transported to X-ray 

## 2018-12-08 ENCOUNTER — Encounter (HOSPITAL_COMMUNITY): Payer: Self-pay | Admitting: Emergency Medicine

## 2018-12-19 ENCOUNTER — Ambulatory Visit: Payer: Medicare Other | Admitting: Neurology

## 2019-01-31 ENCOUNTER — Telehealth: Payer: Self-pay | Admitting: *Deleted

## 2019-01-31 NOTE — Telephone Encounter (Signed)
Spoke with patient and informed her that her appointment with Dr Jannifer Franklin next Monday has been canceled; MD will be out of office. I advised she'll get a call next week to reschedule. Patient verbalized understanding, appreciation.

## 2019-02-05 ENCOUNTER — Institutional Professional Consult (permissible substitution): Payer: Medicare Other | Admitting: Neurology

## 2019-02-12 ENCOUNTER — Encounter: Payer: Self-pay | Admitting: Neurology

## 2019-02-12 ENCOUNTER — Institutional Professional Consult (permissible substitution): Payer: Medicare Other | Admitting: Neurology

## 2019-02-12 ENCOUNTER — Telehealth: Payer: Self-pay | Admitting: *Deleted

## 2019-02-12 NOTE — Telephone Encounter (Signed)
No showed her consultation appointment today.

## 2019-04-06 ENCOUNTER — Other Ambulatory Visit: Payer: Self-pay

## 2019-04-06 ENCOUNTER — Emergency Department (HOSPITAL_COMMUNITY)
Admission: EM | Admit: 2019-04-06 | Discharge: 2019-04-06 | Disposition: A | Discharge status: Home / Self Care | Payer: Medicare Other | Attending: Emergency Medicine | Admitting: Emergency Medicine

## 2019-04-06 DIAGNOSIS — E039 Hypothyroidism, unspecified: Secondary | ICD-10-CM | POA: Insufficient documentation

## 2019-04-06 DIAGNOSIS — Z79899 Other long term (current) drug therapy: Secondary | ICD-10-CM | POA: Diagnosis not present

## 2019-04-06 DIAGNOSIS — F17200 Nicotine dependence, unspecified, uncomplicated: Secondary | ICD-10-CM | POA: Insufficient documentation

## 2019-04-06 DIAGNOSIS — F1910 Other psychoactive substance abuse, uncomplicated: Secondary | ICD-10-CM | POA: Diagnosis not present

## 2019-04-06 DIAGNOSIS — F1010 Alcohol abuse, uncomplicated: Secondary | ICD-10-CM | POA: Insufficient documentation

## 2019-04-06 DIAGNOSIS — F419 Anxiety disorder, unspecified: Secondary | ICD-10-CM | POA: Diagnosis not present

## 2019-04-06 DIAGNOSIS — E109 Type 1 diabetes mellitus without complications: Secondary | ICD-10-CM | POA: Insufficient documentation

## 2019-04-06 DIAGNOSIS — F410 Panic disorder [episodic paroxysmal anxiety] without agoraphobia: Secondary | ICD-10-CM | POA: Diagnosis present

## 2019-04-06 LAB — COMPREHENSIVE METABOLIC PANEL
ALT: 29 U/L (ref 0–44)
AST: 22 U/L (ref 15–41)
Albumin: 5.3 g/dL — ABNORMAL HIGH (ref 3.5–5.0)
Alkaline Phosphatase: 40 U/L (ref 38–126)
Anion gap: 14 (ref 5–15)
BUN: 24 mg/dL — ABNORMAL HIGH (ref 6–20)
CO2: 22 mmol/L (ref 22–32)
Calcium: 9.7 mg/dL (ref 8.9–10.3)
Chloride: 101 mmol/L (ref 98–111)
Creatinine, Ser: 0.77 mg/dL (ref 0.44–1.00)
GFR calc Af Amer: >60 mL/min (ref >60)
GFR calc non Af Amer: >60 mL/min (ref >60)
Glucose, Bld: 157 mg/dL — ABNORMAL HIGH (ref 70–99)
Potassium: 3.8 mmol/L (ref 3.5–5.1)
Sodium: 137 mmol/L (ref 135–145)
Total Bilirubin: 0.7 mg/dL (ref 0.3–1.2)
Total Protein: 8.5 g/dL — ABNORMAL HIGH (ref 6.5–8.1)

## 2019-04-06 LAB — SALICYLATE LEVEL: Salicylate Lvl: <7 mg/dL — ABNORMAL LOW (ref 7.0–30.0)

## 2019-04-06 LAB — CBC WITH DIFFERENTIAL/PLATELET
Abs Immature Granulocytes: 0.05 10*3/uL (ref 0.00–0.07)
Basophils Absolute: 0 10*3/uL (ref 0.0–0.1)
Basophils Relative: 0 %
Eosinophils Absolute: 0 10*3/uL (ref 0.0–0.5)
Eosinophils Relative: 0 %
HCT: 48.3 % — ABNORMAL HIGH (ref 36.0–46.0)
Hemoglobin: 16.7 g/dL — ABNORMAL HIGH (ref 12.0–15.0)
Immature Granulocytes: 1 %
Lymphocytes Relative: 19 %
Lymphs Abs: 2 10*3/uL (ref 0.7–4.0)
MCH: 31.8 pg (ref 26.0–34.0)
MCHC: 34.6 g/dL (ref 30.0–36.0)
MCV: 92 fL (ref 80.0–100.0)
Monocytes Absolute: 0.7 10*3/uL (ref 0.1–1.0)
Monocytes Relative: 7 %
Neutro Abs: 7.4 10*3/uL (ref 1.7–7.7)
Neutrophils Relative %: 73 %
Platelets: 361 10*3/uL (ref 150–400)
RBC: 5.25 MIL/uL — ABNORMAL HIGH (ref 3.87–5.11)
RDW: 12.4 % (ref 11.5–15.5)
WBC: 10.2 10*3/uL (ref 4.0–10.5)
nRBC: 0 % (ref 0.0–0.2)

## 2019-04-06 LAB — RAPID URINE DRUG SCREEN, HOSP PERFORMED
Amphetamines: NOT DETECTED
Barbiturates: NOT DETECTED
Benzodiazepines: POSITIVE — AB
Cocaine: POSITIVE — AB
Opiates: NOT DETECTED
Tetrahydrocannabinol: NOT DETECTED

## 2019-04-06 LAB — ETHANOL: Alcohol, Ethyl (B): <10 mg/dL (ref <10)

## 2019-04-06 LAB — ACETAMINOPHEN LEVEL: Acetaminophen (Tylenol), Serum: <10 ug/mL — ABNORMAL LOW (ref 10–30)

## 2019-04-06 MED ORDER — HYDROXYZINE HCL 25 MG PO TABS
25.0000 mg | ORAL_TABLET | Freq: Once | ORAL | Status: AC
  Administered 2019-04-06: 25 mg via ORAL
  Filled 2019-04-06: qty 1

## 2019-04-06 MED ORDER — HYDROXYZINE HCL 25 MG PO TABS: 25.0000 mg | ORAL_TABLET | Freq: Four times a day (QID) | ORAL | 0 refills | Status: DC | PRN

## 2019-04-06 NOTE — ED Notes (Signed)
Patient has 1 belonging bag in locker. She also has 1 small bag in security.

## 2019-04-06 NOTE — ED Notes (Signed)
Pt DC off unit to home per provider. Pt alert, cooperative, anxious. Dc information given to and reviewed with pt, pt acknowledged understanding.  Belongings given to pt.  Pt ambulatory off unit, escorted by security.  Pt transported by family member.

## 2019-04-06 NOTE — ED Triage Notes (Signed)
Pt brought to [facility] by EMS. Pt has trying to drive to hospital bc of anxiety attach. Pt pulled over and call EMS. Pt was disoriented and stated did not remember much since Tuesday. Pt stated did some substance on Tuesday, does not know what. EMS gave 5 haldol IM. Pt to room 27. Pt alert and oriented at present, pt changing clothes.

## 2019-04-06 NOTE — ED Provider Notes (Signed)
[facility] DEPT Provider Note   CSN: [id number] Arrival date & time: 04/06/19  1228     History Chief Complaint  Patient presents with  . Psychiatric Evaluation    psych eval. anxiety    Kari Gonzales is a 59 y.o. female.  The history is provided by the patient and medical records. No language interpreter was used.     59 year old female with history of diabetes, bipolar, brought here via EMS for complaints of panic attack.  Per EMS note, patient was on her way to the hospital when she had a anxiety attack and subsequently have to call for help.  EMS arrived and patient was disoriented.  Patient received 5 mg of Haldol IM and at this time she is more cohesive and more oriented.  Patient states she has history of alcohol abuse.  States sometimes she would drink until she no longer able to control herself.  Most recent episode was 4 days ago when she admittedly drink a large amount of alcohol and may have used some drugs that she is unable to recall what type.  Since then she does not remember much of what happened.  Patient states she is ashamed that she cannot control herself when she drinks heavily and is here seeking help with her alcohol abuse.  She denies any history of IV drug use.  She denies SI or HI.  She reported having trouble sleeping and eating.  She does not complain of any cold symptoms.  No runny nose sneezing or coughing or shortness of breath.  Past Medical History:  Diagnosis Date  . Bipolar disorder ([place])   . Chronic back pain   . Coccygeal fracture ([place])   . Degenerative disk disease   . Depression   . Diabetes mellitus   . Diabetic neuropathy ([place])   . Hypothyroid   . Lumbar herniated disc    x2  . Osteoporosis   . Panic attacks   . Sciatica   . Sciatica of left side 05/07/2013  . Vitamin D deficiency     Patient Active Problem List   Diagnosis Date Noted  . Ovarian cyst, right 06/16/2016  . Age-related osteoporosis without  current pathological fracture 06/03/2016  . Sciatica of left side 05/07/2013  . DIABETES MELLITUS, TYPE I 04/08/2010  . BACK PAIN, LUMBAR, CHRONIC 04/08/2010  . SACROILIAC JOINT DYSFUNCTION 04/08/2010  . TROCHANTERIC BURSITIS, LEFT 04/08/2010  . OSTEOPOROSIS 04/08/2010  . UNEQUAL LEG LENGTH 04/08/2010  . HYPOTHYROIDISM 07/01/2009  . WEAKNESS 07/01/2009  . PALPITATIONS 07/01/2009    Past Surgical History:  Procedure Laterality Date  . ABDOMINAL HYSTERECTOMY     partial  . CLOSED REDUCTION PROXIMAL FIBULAR FRACTURE    . FOOT SURGERY Left   . TONSILLECTOMY       OB History    Gravida  4   Para  1   Term      Preterm      AB  3   Living  1     SAB      TAB      Ectopic  0   Multiple      Live Births              Family History  Problem Relation Age of Onset  . Diabetes Mother   . Osteoarthritis Mother   . Depression Mother   . Raynaud syndrome Mother   . Hypertension Mother   . Cancer Mother  uterine  . COPD Mother   . Depression Father   . Hypertension Father   . Hypotension Father   . Cancer Maternal Grandmother        uterine  . Breast cancer Paternal Grandmother     Social History   Tobacco Use  . Smoking status: Current Every Day Smoker    Packs/day: 1.00  . Smokeless tobacco: Never Used  Substance Use Topics  . Alcohol use: Yes    Comment: occasional  . Drug use: Yes    Types: "Crack" cocaine    Home Medications Prior to Admission medications   Medication Sig Start Date End Date Taking? Authorizing Provider  acetaminophen (TYLENOL) 325 MG tablet Take 650 mg by mouth every 6 (six) hours as needed for mild pain or headache.   Yes [provider]  albuterol (PROVENTIL) (2.5 MG/3ML) 0.083% nebulizer solution Take 2.5 mg by nebulization daily as needed for shortness of breath.  04/23/14  Yes [provider]  ALPRAZolam Prudy Feeler) 1 MG tablet Take 1 mg by mouth 4 (four) times daily as needed. anxiety   Yes  [provider]  amphetamine-dextroamphetamine (ADDERALL) 30 MG tablet Take 1 tablet by mouth 2 (two) times daily. 04/07/13  Yes [provider]  carisoprodol (SOMA) 350 MG tablet Take 350 mg by mouth 4 (four) times daily as needed for muscle spasms.   Yes [provider]  diclofenac Sodium (VOLTAREN) 1 % GEL Apply 2 g topically 4 (four) times daily as needed (pain).   Yes [provider]  DULoxetine (CYMBALTA) 60 MG capsule Take 120 mg by mouth daily. 03/18/19  Yes [provider]  fenofibrate (TRICOR) 145 MG tablet Take 145 mg by mouth daily. 02/08/19  Yes [provider]  fluticasone (FLONASE) 50 MCG/ACT nasal spray Place 2 sprays into the nose daily as needed for allergies.    Yes [provider]  gabapentin (NEURONTIN) 600 MG tablet Take 600 mg by mouth at bedtime.   Yes [provider]  ibuprofen (ADVIL) 200 MG tablet Take 400 mg by mouth every 6 (six) hours as needed for fever, headache or moderate pain.   Yes [provider]  lamoTRIgine (LAMICTAL) 100 MG tablet Take 50 mg by mouth daily.  03/20/19  Yes [provider]  levothyroxine (SYNTHROID) 75 MCG tablet Take 75 mcg by mouth daily before breakfast.   Yes [provider]  lidocaine (LIDODERM) 5 % Place 1 patch onto the skin daily as needed (pain). Remove & Discard patch within 12 hours or as directed by MD   Yes [provider]  metoprolol tartrate (LOPRESSOR) 25 MG tablet Take 25 mg by mouth 2 (two) times daily.   Yes [provider]  Oxycodone HCl 10 MG TABS Take 10 mg by mouth 4 (four) times daily as needed (For pain).    Yes [provider]  promethazine (PHENERGAN) 25 MG tablet Take 25 mg by mouth every 6 (six) hours as needed for nausea or vomiting.   Yes [provider]  zolpidem (AMBIEN CR) 12.5 MG CR tablet Take 12.5 mg by mouth at bedtime as needed for sleep.  01/08/16  Yes [provider]    Vitamin D, Ergocalciferol, (DRISDOL) 50000 units CAPS capsule Take 1 capsule (50,000 Units total) by mouth every 7 (seven) days. Patient not taking: Reported on 04/06/2019 04/20/16   Ok Edwards, MD    Allergies    Sulfonamide derivatives, Morphine, Prednisone, Aspirin, and Ibuprofen  Review of  Systems   Review of Systems  All other systems reviewed and are negative.   Physical Exam Updated Vital Signs BP (!) 163/89 (BP Location: Left Arm)   Pulse 91   Temp 98.3 F (36.8 C) (Oral)   Resp 18   SpO2 95%   Physical Exam Vitals and nursing note reviewed.  Constitutional:      General: She is not in acute distress.    Appearance: She is well-developed.  HENT:     Head: Atraumatic.  Eyes:     Conjunctiva/sclera: Conjunctivae normal.  Cardiovascular:     Rate and Rhythm: Normal rate and regular rhythm.     Pulses: Normal pulses.     Heart sounds: Normal heart sounds.  Pulmonary:     Effort: Pulmonary effort is normal.     Breath sounds: Normal breath sounds.  Abdominal:     Palpations: Abdomen is soft.     Tenderness: There is no abdominal tenderness.  Musculoskeletal:     Cervical back: Neck supple.  Skin:    Findings: No rash.  Neurological:     Mental Status: She is alert and oriented to person, place, and time.     GCS: GCS eye subscore is 4. GCS verbal subscore is 5. GCS motor subscore is 6.  Psychiatric:        Mood and Affect: Mood is anxious.        Speech: Speech normal.        Behavior: Behavior is cooperative.        Thought Content: Thought content does not include homicidal or suicidal ideation.     ED Results / Procedures / Treatments   Labs (all labs ordered are listed, but only abnormal results are displayed) Labs Reviewed  COMPREHENSIVE METABOLIC PANEL - Abnormal; Notable for the following components:      Result Value   Glucose, Bld 157 (*)    BUN 24 (*)    Total Protein 8.5 (*)    Albumin 5.3 (*)    All other components within normal  limits  ACETAMINOPHEN LEVEL - Abnormal; Notable for the following components:   Acetaminophen (Tylenol), Serum <10 (*)    All other components within normal limits  SALICYLATE LEVEL - Abnormal; Notable for the following components:   Salicylate Lvl <7.0 (*)    All other components within normal limits  CBC WITH DIFFERENTIAL/PLATELET - Abnormal; Notable for the following components:   RBC 5.25 (*)    Hemoglobin 16.7 (*)    HCT 48.3 (*)    All other components within normal limits  RAPID URINE DRUG SCREEN, HOSP PERFORMED - Abnormal; Notable for the following components:   Cocaine POSITIVE (*)    Benzodiazepines POSITIVE (*)    All other components within normal limits  ETHANOL    EKG None  Radiology No results found.  Procedures Procedures (including critical care time)  Medications Ordered in ED Medications  hydrOXYzine (ATARAX/VISTARIL) tablet 25 mg (25 mg Oral Given 04/06/19 1432)    ED Course  I have reviewed the triage vital signs and the nursing notes.  Pertinent labs & imaging results that were available during my care of the patient were reviewed by me and considered in my medical decision making (see chart for details).    MDM Rules/Calculators/A&P                      BP (!) 163/89 (BP Location: Left Arm)   Pulse 91   Temp  98.3 F (36.8 C) (Oral)   Resp 18   SpO2 95%   Final Clinical Impression(s) / ED Diagnoses Final diagnoses:  Alcohol abuse  Polysubstance abuse (HCC)  Anxiety    Rx / DC Orders ED Discharge Orders         Ordered    hydrOXYzine (ATARAX/VISTARIL) 25 MG tablet  Every 6 hours PRN     04/06/19 1505         2:02 PM Patient report having anxiety as well as having inability to control her alcohol consumption and requesting for help with detox.  No SI or HI.  Patient apparently was when EMS arrived after she started on the side of the road to call for help due to having a panic attack.  At this time, she is alert and oriented x3 and  answering questions appropriately.  3:01 PM Work-up was remarkable for positive cocaine and benzodiazepine.  Alcohol level was undetectable.  Her last alcohol consumption was 4 days ago therefore I have low suspicion for DT or alcohol withdrawal at the moment.  No SI or HI.  Will provide outpatient resource for detox   Fayrene Helper, Cordelia Poche 04/06/19 1511    Sabas Sous, MD 04/10/19 [phone]

## 2019-04-06 NOTE — Discharge Instructions (Addendum)
Please take vistaril as needed for anxiety.  Use resources below to seek help with your substance use disorder. Return if you have concerns.

## 2019-05-14 ENCOUNTER — Encounter: Payer: Self-pay | Admitting: Neurology

## 2019-05-14 ENCOUNTER — Other Ambulatory Visit: Payer: Self-pay

## 2019-05-14 ENCOUNTER — Ambulatory Visit (INDEPENDENT_AMBULATORY_CARE_PROVIDER_SITE_OTHER): Payer: Medicare Other | Admitting: Neurology

## 2019-05-14 ENCOUNTER — Telehealth: Payer: Self-pay | Admitting: Neurology

## 2019-05-14 DIAGNOSIS — F1096 Alcohol use, unspecified with alcohol-induced persisting amnestic disorder: Secondary | ICD-10-CM | POA: Diagnosis not present

## 2019-05-14 HISTORY — DX: Alcohol use, unspecified with alcohol-induced persisting amnestic disorder: F10.96
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
## 2019-05-14 NOTE — Telephone Encounter (Signed)
[employer] medicare order sent to GI. No auth they will reach out to the patient to schedule.  

## 2019-05-28 ENCOUNTER — Other Ambulatory Visit: Payer: Self-pay

## 2019-05-29 ENCOUNTER — Ambulatory Visit: Payer: Medicare Other | Admitting: Obstetrics and Gynecology

## 2019-06-01 ENCOUNTER — Other Ambulatory Visit (HOSPITAL_BASED_OUTPATIENT_CLINIC_OR_DEPARTMENT_OTHER): Payer: Self-pay

## 2019-06-01 DIAGNOSIS — G4709 Other insomnia: Secondary | ICD-10-CM

## 2019-06-11 ENCOUNTER — Other Ambulatory Visit: Payer: Self-pay

## 2019-06-11 ENCOUNTER — Ambulatory Visit (INDEPENDENT_AMBULATORY_CARE_PROVIDER_SITE_OTHER): Payer: Medicare Other | Admitting: Neurology

## 2019-06-11 ENCOUNTER — Telehealth: Payer: Self-pay | Admitting: Neurology

## 2019-06-11 DIAGNOSIS — F1096 Alcohol use, unspecified with alcohol-induced persisting amnestic disorder: Secondary | ICD-10-CM

## 2019-06-11 DIAGNOSIS — R410 Disorientation, unspecified: Secondary | ICD-10-CM | POA: Diagnosis not present

## 2019-06-11 NOTE — Telephone Encounter (Signed)
I called the patient.  The EEG study was normal. 

## 2019-06-11 NOTE — Procedures (Signed)
    History:  Kari Gonzales is a 59 year old patient with a history of diabetes, bipolar disorder, panic disorder, and history of alcohol and cocaine abuse.  The patient has had several episodes of amnesia associated with heavy drinking.  She has also had similar events while using Xanax.  She has a history of hallucinations at times.  She is being evaluated for these events.  This is a routine EEG.  No skull defects are noted.  Medications include Tylenol, Proventil, Ventolin, Adderall, Zyrtec, Cymbalta, Flonase, Gralise, Lamictal, Synthroid, Linzess, Lopressor, Phenergan, and Ambien.  EEG classification: Normal awake  Description of the recording: The background rhythms of this recording consists of a fairly well modulated medium amplitude alpha rhythm of 11 Hz that is reactive to eye opening and closure. As the record progresses, the patient appears to remain in the waking state throughout the recording. Photic stimulation was performed, resulting in a bilateral and symmetric photic driving response. Hyperventilation was also performed, resulting in a minimal buildup of the background rhythm activities without significant slowing seen. At no time during the recording does there appear to be evidence of spike or spike wave discharges or evidence of focal slowing. EKG monitor shows no evidence of cardiac rhythm abnormalities with a heart rate of 90.  Impression: This is a normal EEG recording in the waking state. No evidence of ictal or interictal discharges are seen.

## 2019-06-12 ENCOUNTER — Ambulatory Visit: Payer: Medicare Other | Admitting: Obstetrics and Gynecology

## 2019-06-12 DIAGNOSIS — Z0289 Encounter for other administrative examinations: Secondary | ICD-10-CM

## 2019-06-15 ENCOUNTER — Ambulatory Visit
Admission: RE | Admit: 2019-06-15 | Discharge: 2019-06-15 | Disposition: A | Discharge status: Home / Self Care | Payer: Medicare Other | Source: Ambulatory Visit | Attending: Neurology | Admitting: Neurology

## 2019-06-15 DIAGNOSIS — F1096 Alcohol use, unspecified with alcohol-induced persisting amnestic disorder: Secondary | ICD-10-CM | POA: Diagnosis not present

## 2019-06-19 ENCOUNTER — Telehealth: Payer: Self-pay | Admitting: Neurology

## 2019-06-19 NOTE — Telephone Encounter (Signed)
I called patient.  The MRI shows minimal white matter changes, the patient does have a history of diabetes.  I indicated that her amnestic events are likely related to excessive alcohol intake, if she curtails this events will disappear.  No further work-up indicated.   MRI brain 06/15/19:  IMPRESSION: This MRI of the brain without contrast shows the following: 1.    There are no acute findings. 2.    Scattered T2/FLAIR hyperintense foci in the pons and hemispheres most consistent with chronic microvascular ischemic changes, more than typical for age.

## 2019-08-17 ENCOUNTER — Other Ambulatory Visit: Payer: Self-pay

## 2019-08-17 ENCOUNTER — Ambulatory Visit (HOSPITAL_BASED_OUTPATIENT_CLINIC_OR_DEPARTMENT_OTHER): Payer: Medicare Other | Attending: Physical Medicine and Rehabilitation | Admitting: Internal Medicine

## 2019-08-17 DIAGNOSIS — G4709 Other insomnia: Secondary | ICD-10-CM

## 2019-08-17 DIAGNOSIS — G4733 Obstructive sleep apnea (adult) (pediatric): Secondary | ICD-10-CM | POA: Diagnosis not present

## 2019-08-25 DIAGNOSIS — G4709 Other insomnia: Secondary | ICD-10-CM | POA: Diagnosis not present

## 2019-08-25 NOTE — Procedures (Signed)
   Patient Name: Kari Gonzales, Kari Gonzales Date: 08/19/2019 Gender: Female D.O.B: 10/10/1960 Age (years): 58 Referring Provider: Verdon Cummins Height (inches): 65 Interpreting Physician: Jetty Duhamel MD, ABSM Weight (lbs): 165 RPSGT: Waldo Sink BMI: 27 MRN: [id number] Neck Size: 14.00  CLINICAL INFORMATION Sleep Study Type: HST Indication for sleep study: OSA with insomnia Epworth Sleepiness Score: 1  SLEEP STUDY TECHNIQUE A multi-channel overnight portable sleep study was performed. The channels recorded were: nasal airflow, thoracic respiratory movement, and oxygen saturation with a pulse oximetry. Snoring was also monitored.  MEDICATIONS Patient self administered medications include: none reported  SLEEP ARCHITECTURE Patient was studied for 462.4 minutes. The sleep efficiency was 100.0 % and the patient was supine for 14.2%. The arousal index was 0.0 per hour.  RESPIRATORY PARAMETERS The overall AHI was 10.3 per hour, with a central apnea index of 0.0 per hour. The oxygen nadir was 90% during sleep.  CARDIAC DATA Mean heart rate during sleep was 82.9 bpm.  IMPRESSIONS - Mild obstructive sleep apnea occurred during this study (AHI = 10.3/h). - No significant central sleep apnea occurred during this study (CAI = 0.0/h). - The patient had minimal or no oxygen desaturation during the study (Min O2 = 90%) - Patient snored.  DIAGNOSIS - Obstructive Sleep Apnea (G47.33)  RECOMMENDATIONS - Suggest CPAP titration sleep study or autopap, a fitted oral appliance or ENT evaluation for the sleep apnea component. Sleep Medical consultation is available if needed. - The patient complains primarily of difiulty initiating and maintaining sleep in records available, suggesting treatment directed at insomnia may also be appropriate. - Be careful with alcohol, sedatives and other CNS depressants that may worsen sleep apnea and disrupt normal sleep architecture. - Sleep hygiene  should be reviewed to assess factors that may improve sleep quality. - Weight management and regular exercise should be initiated or continued.  [Electronically signed] 08/25/2019 09:08 AM  Jetty Duhamel MD, ABSM Diplomate, American Board of Sleep Medicine   NPI: [id number]                          Jetty Duhamel Diplomate, American Board of Sleep Medicine  ELECTRONICALLY SIGNED ON:  08/25/2019, 9:03 AM [facility] PH: [phone]   FX: [phone] ACCREDITED BY THE AMERICAN ACADEMY OF SLEEP MEDICINE

## 2019-09-17 ENCOUNTER — Institutional Professional Consult (permissible substitution): Payer: Medicare Other | Admitting: Internal Medicine

## 2019-09-17 NOTE — Progress Notes (Deleted)
09/17/19- 58 yoF Smoker for sleep evaluation courtesy of Dr Manon Hilding. HST 08/19/19- AHI 10.3/ hr, desaturation to 90%,  Body weight 165 lbs Medical problem list includes DM1, BiPOLAR, Panic Disorder, Hx ETOH/ Cocaine abuse, Tobacco abuse, Hypothyroid, Osteoarthritis,  Medications noted to include Xanax, albuterol hfa, oxycodone, ambien CR 2.5, Cymbalta, Phenergan, Lamictal, Synthroid, Adderall 30, gabapentin,

## 2020-02-25 ENCOUNTER — Telehealth: Payer: Self-pay | Admitting: *Deleted

## 2020-02-25 NOTE — Telephone Encounter (Signed)
Patient called c/o boil in her inner thigh area noticed x 1 month was pea size and now quarter size and now painful and hard. Patient questioned what this could be. I explained she was last seen in 2018. And Dr.Fernandez has retired and she would be a new patient to the office. She asked if urgent care would be an option, I told her yes or PCP patient declined to see PCP due to him being a female. Patient said she will go to urgent care and call back to schedule annual exam.

## 2020-09-16 ENCOUNTER — Ambulatory Visit
Admission: RE | Admit: 2020-09-16 | Discharge: 2020-09-16 | Disposition: A | Discharge status: Home / Self Care | Payer: Medicare Other | Source: Ambulatory Visit | Attending: Physical Medicine and Rehabilitation | Admitting: Physical Medicine and Rehabilitation

## 2020-09-16 ENCOUNTER — Other Ambulatory Visit: Payer: Self-pay | Admitting: Physical Medicine and Rehabilitation

## 2020-09-16 ENCOUNTER — Other Ambulatory Visit: Payer: Self-pay

## 2020-09-16 DIAGNOSIS — R52 Pain, unspecified: Secondary | ICD-10-CM

## 2021-11-11 ENCOUNTER — Other Ambulatory Visit: Payer: Self-pay | Admitting: Physical Medicine and Rehabilitation

## 2021-11-11 DIAGNOSIS — M5416 Radiculopathy, lumbar region: Secondary | ICD-10-CM

## 2021-12-05 ENCOUNTER — Other Ambulatory Visit: Payer: Medicare Other

## 2022-03-15 ENCOUNTER — Other Ambulatory Visit: Payer: Self-pay | Admitting: Physical Medicine and Rehabilitation

## 2022-03-15 DIAGNOSIS — Z1231 Encounter for screening mammogram for malignant neoplasm of breast: Secondary | ICD-10-CM

## 2022-04-02 ENCOUNTER — Ambulatory Visit
Admission: RE | Admit: 2022-04-02 | Discharge: 2022-04-02 | Disposition: A | Discharge status: Home / Self Care | Payer: 59 | Source: Ambulatory Visit | Attending: Physical Medicine and Rehabilitation | Admitting: Physical Medicine and Rehabilitation

## 2022-04-02 DIAGNOSIS — M5416 Radiculopathy, lumbar region: Secondary | ICD-10-CM

## 2022-04-29 ENCOUNTER — Ambulatory Visit: Payer: Medicare Other

## 2022-12-15 ENCOUNTER — Emergency Department (HOSPITAL_COMMUNITY)
Admission: EM | Admit: 2022-12-15 | Discharge: 2022-12-15 | Disposition: A | Discharge status: Home / Self Care | Payer: 59 | Attending: Emergency Medicine | Admitting: Emergency Medicine

## 2022-12-15 ENCOUNTER — Emergency Department (HOSPITAL_COMMUNITY): Payer: 59

## 2022-12-15 ENCOUNTER — Other Ambulatory Visit: Payer: Self-pay

## 2022-12-15 ENCOUNTER — Encounter (HOSPITAL_COMMUNITY): Payer: Self-pay

## 2022-12-15 DIAGNOSIS — E119 Type 2 diabetes mellitus without complications: Secondary | ICD-10-CM | POA: Insufficient documentation

## 2022-12-15 DIAGNOSIS — S42212A Unspecified displaced fracture of surgical neck of left humerus, initial encounter for closed fracture: Secondary | ICD-10-CM | POA: Insufficient documentation

## 2022-12-15 DIAGNOSIS — S80211A Abrasion, right knee, initial encounter: Secondary | ICD-10-CM | POA: Insufficient documentation

## 2022-12-15 DIAGNOSIS — W19XXXA Unspecified fall, initial encounter: Secondary | ICD-10-CM | POA: Insufficient documentation

## 2022-12-15 DIAGNOSIS — M25512 Pain in left shoulder: Secondary | ICD-10-CM | POA: Diagnosis not present

## 2022-12-15 DIAGNOSIS — S80212A Abrasion, left knee, initial encounter: Secondary | ICD-10-CM | POA: Diagnosis not present

## 2022-12-15 DIAGNOSIS — S4992XA Unspecified injury of left shoulder and upper arm, initial encounter: Secondary | ICD-10-CM | POA: Diagnosis present

## 2022-12-15 MED ORDER — OXYCODONE HCL 5 MG PO TABS: 5.0000 mg | ORAL_TABLET | Freq: Four times a day (QID) | ORAL | 0 refills | Status: AC | PRN

## 2022-12-15 MED ORDER — HYDROMORPHONE HCL 1 MG/ML IJ SOLN
1.0000 mg | Freq: Once | INTRAMUSCULAR | Status: AC
  Administered 2022-12-15: 1 mg via INTRAVENOUS
  Filled 2022-12-15: qty 1

## 2022-12-15 MED ORDER — HYDROMORPHONE HCL 2 MG/ML IJ SOLN
2.0000 mg | Freq: Once | INTRAMUSCULAR | Status: AC
  Administered 2022-12-15: 2 mg via INTRAMUSCULAR
  Filled 2022-12-15: qty 1

## 2022-12-15 NOTE — ED Provider Notes (Signed)
[facility] EMERGENCY DEPARTMENT AT [facility] Provider Note   CSN: [id number] Arrival date & time: 12/15/22  1107     History  Chief Complaint  Patient presents with   Shoulder Injury    Kari Gonzales is a 62 y.o. female.  Patient is a 62 year old female with a history of bipolar disease, sciatica, diabetes who is presenting today after a fall.  She reports that she always has balance issues due to her sciatica but was walking in with groceries in both hands and started to lose her balance.  She went to grab the table but it gave way and she fell directly on her left shoulder.  She is skinned both of her knees but denies any pain there but has ongoing severe pain in her left shoulder.  No numbness or tingling in her arm.  She denies hitting her head or loss of consciousness.  Patient was given 200 mcg of fentanyl prior to arrival and reports some mild improvement in pain but now the pain is back.  The history is provided by the patient.  Shoulder Injury       Home Medications Prior to Admission medications   Medication Sig Start Date End Date Taking? Authorizing Provider  acetaminophen (TYLENOL) 325 MG tablet Take 650 mg by mouth every 6 (six) hours as needed for mild pain or headache.    [provider]  albuterol (PROVENTIL) (2.5 MG/3ML) 0.083% nebulizer solution Take 2.5 mg by nebulization daily as needed for shortness of breath.  04/23/14   [provider]  albuterol (VENTOLIN HFA) 108 (90 Base) MCG/ACT inhaler 1 puff 2 (two) times daily. 04/23/19   [provider]  ALPRAZolam Prudy Feeler) 1 MG tablet Take 1 mg by mouth 4 (four) times daily as needed. anxiety    [provider]  amphetamine-dextroamphetamine (ADDERALL) 30 MG tablet Take 1 tablet by mouth 2 (two) times daily. 04/07/13   [provider]  carisoprodol (SOMA) 350 MG tablet Take 350 mg by mouth 4 (four) times daily as needed for muscle spasms.    [provider]  cetirizine (ZYRTEC) 10 MG tablet Take 10 mg by mouth every morning. 04/23/19   [provider]  diclofenac Sodium (VOLTAREN) 1 % GEL Apply 2 g topically 4 (four) times daily as needed (pain).    [provider]  DULoxetine (CYMBALTA) 60 MG capsule Take 120 mg by mouth daily. 03/18/19   [provider]  fenofibrate (TRICOR) 145 MG tablet Take 145 mg by mouth daily. 02/08/19   [provider]  fluticasone (FLONASE) 50 MCG/ACT nasal spray Place 2 sprays into the nose daily as needed for allergies.     [provider]  GRALISE 600 MG TABS Take 2 tablets by mouth at bedtime. 04/23/19   [provider]  ibuprofen (ADVIL) 200 MG tablet Take 400 mg by mouth every 6 (six) hours as needed for fever, headache or moderate pain.    [provider]  lamoTRIgine (LAMICTAL) 100 MG tablet Take 50 mg by mouth daily.  03/20/19   [provider]  levothyroxine (SYNTHROID) 75 MCG tablet Take 75 mcg by mouth daily before breakfast.    [provider]  lidocaine (LIDODERM) 5 % Place 1 patch onto the skin daily as needed (pain). Remove & Discard patch within 12 hours or as directed by MD    [provider]  LINZESS 72 MCG capsule Take 72 mcg by mouth daily. 04/30/19   [provider]  metoprolol tartrate (LOPRESSOR) 25 MG tablet Take 25 mg by mouth 2 (two) times daily.    [provider]  Oxycodone HCl 10 MG TABS Take 10 mg by mouth 4 (four) times daily as needed (For pain).     [provider]  promethazine (PHENERGAN) 25 MG tablet Take 25 mg by mouth every 6 (six) hours as needed for nausea or vomiting.    [provider]  zolpidem (AMBIEN CR) 12.5 MG CR tablet Take 12.5 mg by mouth at bedtime as needed for sleep.  01/08/16   [provider]      Allergies    Sulfonamide derivatives, Morphine, Prednisone, Aspirin, and Ibuprofen    Review of Systems   Review of Systems  Physical  Exam Updated Vital Signs BP (!) 144/91   Pulse (!) 107   Temp 97.9 F (36.6 C) (Oral)   Resp 18   SpO2 97%  Physical Exam Vitals and nursing note reviewed.  Constitutional:      General: She is not in acute distress.    Appearance: She is well-developed.  HENT:     Head: Normocephalic and atraumatic.  Eyes:     Pupils: Pupils are equal, round, and reactive to light.  Cardiovascular:     Rate and Rhythm: Normal rate and regular rhythm.     Heart sounds: Normal heart sounds. No murmur heard.    No friction rub.  Pulmonary:     Effort: Pulmonary effort is normal.     Breath sounds: Normal breath sounds. No wheezing or rales.  Musculoskeletal:        General: Tenderness present.     Left shoulder: Deformity and tenderness present. Decreased range of motion. Normal pulse.     Right hip: Normal.     Left hip: Normal.     Right knee: No swelling or deformity. Normal range of motion. No tenderness.     Left knee: No swelling or deformity. Normal range of motion. No tenderness.       Legs:     Comments: No edema  Skin:    General: Skin is warm and dry.     Findings: No rash.  Neurological:     Mental Status: She is alert and oriented to person, place, and time.     Cranial Nerves: No cranial nerve deficit.     Sensory: No sensory deficit.     Motor: No weakness.  Psychiatric:        Behavior: Behavior normal.     ED Results / Procedures / Treatments   Labs (all labs ordered are listed, but only abnormal results are displayed) Labs Reviewed - No data to display  EKG None  Radiology DG Shoulder Left Port  Result Date: 12/15/2022 CLINICAL DATA:  Shoulder pain status post fall Obvious deformity EXAM: LEFT SHOULDER COMPARISON:  None available FINDINGS: Comminuted, minimally displaced fracture of the left humeral neck. Visualized soft tissues are unremarkable. IMPRESSION: Acute, comminuted, minimally displaced fracture of the left humeral neck. Electronically Signed   By:  Acquanetta Belling M.D.   On: 12/15/2022 15:06    Procedures Procedures    Medications Ordered in ED Medications  HYDROmorphone (DILAUDID) injection 2 mg (has no administration in time range)  HYDROmorphone (DILAUDID) injection 1 mg (1 mg Intravenous Given 12/15/22 1200)  HYDROmorphone (DILAUDID) injection 1 mg (1 mg Intravenous Given 12/15/22 1340)    ED Course/ Medical Decision Making/ A&P  Medical Decision Making Amount and/or Complexity of Data Reviewed Radiology: ordered and independent interpretation performed. Decision-making details documented in ED Course.  Risk Prescription drug management.   Patient presenting today after a fall.  Pain and mild deformity noted to the left shoulder and concern for dislocation versus fracture.  Patient given further pain control.  Imaging pending.  She has some abrasions on her knee but otherwise full range of motion and low suspicion for acute injury to the knees bilaterally.  No head injury or loss of consciousness today.  I have independently visualized and interpreted pt's images today.  Left shoulder appears to have a humeral neck fracture.  Radiology reports acute comminuted minimally displaced fracture.  Discussed these findings with the patient.  Currently she is on chronic pain medication at 10 mg of Percocet 5 times a day.  She is under a pain contract.  Currently pain is controlled after Dilaudid.  She had a sling placed and will need follow-up with orthopedics.  She is going to call her pain clinic so they can adjust her medication as needed.  At this time she is stable for discharge home.        Final Clinical Impression(s) / ED Diagnoses Final diagnoses:  Closed displaced fracture of surgical neck of left humerus, unspecified fracture morphology, initial encounter    Rx / DC Orders ED Discharge Orders     None         Gwyneth Sprout, MD 12/15/22 1536

## 2022-12-15 NOTE — ED Triage Notes (Signed)
BIBA from home- Larey Seat on to left shoulder, obvious deformity. 200 mcg fentanyl, 4 mg zofran given PTA. Pain is still 8/10. 154/98 BP 90 HR 98% room air 22 rr

## 2022-12-15 NOTE — Discharge Instructions (Signed)
You did break your humerus today.  There is no dislocation.  You cannot lift anything with the left arm and you need to keep it in the sling all the time except when you shower.

## 2022-12-15 NOTE — Progress Notes (Signed)
Orthopedic Tech Progress Note Patient Details:  Kari Gonzales 14-Dec-1960 [id number]  Ortho Devices Type of Ortho Device: Shoulder immobilizer Ortho Device/Splint Location: left Ortho Device/Splint Interventions: Ordered, Application, Adjustment   Post Interventions Patient Tolerated: Well Instructions Provided: Care of device, Adjustment of device  Kizzie Fantasia 12/15/2022, 3:35 PM

## 2023-08-30 NOTE — Procedures (Signed)
 Kari Gonzales

## 2024-01-05 ENCOUNTER — Other Ambulatory Visit: Payer: Self-pay | Admitting: Physical Medicine and Rehabilitation

## 2024-01-05 ENCOUNTER — Ambulatory Visit
Admission: RE | Admit: 2024-01-05 | Discharge: 2024-01-05 | Disposition: A | Source: Ambulatory Visit | Attending: Physical Medicine and Rehabilitation | Admitting: Physical Medicine and Rehabilitation

## 2024-01-05 DIAGNOSIS — M25512 Pain in left shoulder: Secondary | ICD-10-CM
